# Patient Record
Sex: Female | Born: 2000 | Race: White | Hispanic: No | Marital: Single | Smoking: Never smoker
Health system: Southern US, Community
[De-identification: ages and names within clinical notes are randomized; demographics above are authoritative.]

## PROBLEM LIST (undated history)

## (undated) DIAGNOSIS — F4010 Social phobia, unspecified: Secondary | ICD-10-CM

## (undated) DIAGNOSIS — F913 Oppositional defiant disorder: Secondary | ICD-10-CM

## (undated) DIAGNOSIS — R4689 Other symptoms and signs involving appearance and behavior: Secondary | ICD-10-CM

## (undated) DIAGNOSIS — F329 Major depressive disorder, single episode, unspecified: Secondary | ICD-10-CM

## (undated) DIAGNOSIS — F410 Panic disorder [episodic paroxysmal anxiety] without agoraphobia: Secondary | ICD-10-CM

## (undated) DIAGNOSIS — R4586 Emotional lability: Secondary | ICD-10-CM

## (undated) HISTORY — PX: NO PAST SURGERIES: SHX2092

---
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
## 2019-03-23 ENCOUNTER — Other Ambulatory Visit: Payer: Self-pay

## 2019-03-23 ENCOUNTER — Encounter: Payer: Self-pay | Admitting: Obstetrics and Gynecology

## 2019-03-23 ENCOUNTER — Ambulatory Visit (INDEPENDENT_AMBULATORY_CARE_PROVIDER_SITE_OTHER): Payer: Medicaid Other | Admitting: Obstetrics and Gynecology

## 2019-03-23 ENCOUNTER — Other Ambulatory Visit (HOSPITAL_COMMUNITY)
Admission: RE | Admit: 2019-03-23 | Discharge: 2019-03-23 | Disposition: A | Discharge status: Home / Self Care | Payer: Medicaid Other | Source: Ambulatory Visit | Attending: Obstetrics and Gynecology | Admitting: Obstetrics and Gynecology

## 2019-03-23 VITALS — BP 118/74 | Wt 142.0 lb

## 2019-03-23 DIAGNOSIS — Z3491 Encounter for supervision of normal pregnancy, unspecified, first trimester: Secondary | ICD-10-CM | POA: Insufficient documentation

## 2019-03-23 DIAGNOSIS — Z3401 Encounter for supervision of normal first pregnancy, first trimester: Secondary | ICD-10-CM | POA: Diagnosis not present

## 2019-03-23 DIAGNOSIS — Z113 Encounter for screening for infections with a predominantly sexual mode of transmission: Secondary | ICD-10-CM

## 2019-03-23 DIAGNOSIS — Z3A09 9 weeks gestation of pregnancy: Secondary | ICD-10-CM | POA: Diagnosis not present

## 2019-03-23 DIAGNOSIS — Z3490 Encounter for supervision of normal pregnancy, unspecified, unspecified trimester: Secondary | ICD-10-CM | POA: Insufficient documentation

## 2019-03-23 LAB — OB RESULTS CONSOLE RPR: RPR: NONREACTIVE

## 2019-03-23 LAB — OB RESULTS CONSOLE RUBELLA ANTIBODY, IGM: Rubella: NON-IMMUNE/NOT IMMUNE

## 2019-03-23 LAB — OB RESULTS CONSOLE HIV ANTIBODY (ROUTINE TESTING): HIV: NONREACTIVE

## 2019-03-23 LAB — OB RESULTS CONSOLE VARICELLA ZOSTER ANTIBODY, IGG: Varicella: IMMUNE

## 2019-03-23 LAB — OB RESULTS CONSOLE HEPATITIS B SURFACE ANTIGEN: Hepatitis B Surface Ag: NEGATIVE

## 2019-03-23 NOTE — Progress Notes (Signed)
New Obstetric Patient H&P   Chief Complaint: "Desires prenatal care"   History of Present Illness: Patient is a 18 y.o. G1P0 Not Hispanic or Latino female, LMP 01/19/2019 presents with amenorrhea and positive home pregnancy test. Based on her  LMP, her EDD is Estimated Date of Delivery: 10/26/19 and her EGA is [redacted]w[redacted]d. Cycles are 5. days, regular, and occur approximately every : 28 days.     She had a urine pregnancy test which was positive 3 week(s)  ago. Her last menstrual period was normal and lasted for  5 day(s). Since her LMP she claims she has experienced no issues. She denies vaginal bleeding. Her past medical history is noncontributory. No prior pregnancies.  Since her LMP, she admits to the use of tobacco products  no She claims she has gained 10 pounds since the start of her pregnancy.  There are cats in the home in the home  no  She admits close contact with children on a regular basis  no  She has had chicken pox in the past no (received vaccine) She has had Tuberculosis exposures, symptoms, or previously tested positive for TB   no Current or past history of domestic violence. no  Genetic Screening/Teratology Counseling: (Includes patient, baby's father, or anyone in either family with:)   1. Patient's age >/= 63 at [facility]  no 2. Thalassemia ([place], [place], Mediterranean, or Asian background): MCV<80  no 3. Neural tube defect (meningomyelocele, spina bifida, anencephaly)  no 4. Congenital heart defect  no  5. Down syndrome  no 6. Tay-Sachs (Jewish, [place]  no 7. Canavan's Disease  no 8. Sickle cell disease or trait (African)  no  9. Hemophilia or other blood disorders  no  10. Muscular dystrophy  no  11. Cystic fibrosis  no  12. Huntington's Chorea  no  13. Mental retardation/autism  no 14. Other inherited genetic or chromosomal disorder  no 15. Maternal metabolic disorder (DM, PKU, etc)  no 16. Patient or FOB with a child with a birth defect not listed above no   16a. Patient or FOB with a birth defect themselves no 17. Recurrent pregnancy loss, or stillbirth  no  18. Any medications since LMP other than prenatal vitamins (include vitamins, supplements, OTC meds, drugs, alcohol)  no 19. Any other genetic/environmental exposure to discuss  no  Infection History:   1. Lives with someone with TB or TB exposed  no  2. Patient or partner has history of genital herpes  no 3. Rash or viral illness since LMP  no 4. History of STI (GC, CT, HPV, syphilis, HIV)  no 5. History of recent travel :  no  Other pertinent information:  no     Review of Systems:10 point review of systems negative unless otherwise noted in HPI  Past Medical History:  Diagnosis Date  . Major depressive episode 09/07/2015  . Mood swings   . Oppositional behavior   . Panic attacks 09/07/2015  . Social anxiety disorder 09/07/2015    History reviewed. No pertinent surgical history.  Gynecologic History: Patient's last menstrual period was 01/19/2019.  Obstetric History: G1P0  Family History  Problem Relation Age of Onset  . Thyroid disease Mother     Social History   Socioeconomic History  . Marital status: Single    Spouse name: Not on file  . Number of children: Not on file  . Years of education: Not on file  . Highest education level: Not on file  Occupational History  .  Not on file  Social Needs  . Financial resource strain: Not on file  . Food insecurity:    Worry: Not on file    Inability: Not on file  . Transportation needs:    Medical: Not on file    Non-medical: Not on file  Tobacco Use  . Smoking status: Never Smoker  . Smokeless tobacco: Never Used  Substance and Sexual Activity  . Alcohol use: No  . Drug use: No  . Sexual activity: Yes    Birth control/protection: None  Lifestyle  . Physical activity:    Days per week: Not on file    Minutes per session: Not on file  . Stress: Not on file  Relationships  . Social connections:     Talks on phone: Not on file    Gets together: Not on file    Attends religious service: Not on file    Active member of club or organization: Not on file    Attends meetings of clubs or organizations: Not on file    Relationship status: Not on file  . Intimate partner violence:    Fear of current or ex partner: Not on file    Emotionally abused: Not on file    Physically abused: Not on file    Forced sexual activity: Not on file  Other Topics Concern  . Not on file  Social History Narrative  . Not on file    Allergies  Allergen Reactions  . Amoxicillin     Yeast infections  . Zofran [Ondansetron Hcl]     Prior to Admission medications: PNVs    Physical Exam BP 118/74   Wt 142 lb (64.4 kg)   LMP 01/19/2019   Physical Exam Exam conducted with a chaperone present.  Constitutional:      General: She is not in acute distress.    Appearance: Normal appearance.  HENT:     Head: Normocephalic and atraumatic.  Eyes:     General: No scleral icterus.    Conjunctiva/sclera: Conjunctivae normal.  Neck:     Musculoskeletal: Normal range of motion and neck supple. No neck rigidity.  Cardiovascular:     Rate and Rhythm: Normal rate and regular rhythm.     Heart sounds: No murmur. No friction rub. No gallop.   Pulmonary:     Effort: Pulmonary effort is normal.     Breath sounds: Normal breath sounds. No wheezing, rhonchi or rales.  Abdominal:     General: There is no distension.     Palpations: Abdomen is soft. There is no mass.     Tenderness: There is no abdominal tenderness. There is no guarding or rebound.  Genitourinary:    General: Normal vulva.     Exam position: Lithotomy position.     Pubic Area: No rash or pubic lice.      Labia:        Right: No rash, tenderness, lesion or injury.        Left: No rash, tenderness, lesion or injury.      Vagina: Normal.     Cervix: Normal.     Uterus: Normal.      Adnexa: Right adnexa normal.  Musculoskeletal: Normal range of  motion.        General: No swelling.  Lymphadenopathy:     Lower Body: No right inguinal adenopathy. No left inguinal adenopathy.  Skin:    General: Skin is warm and dry.     Coloration: Skin is not  jaundiced.  Neurological:     General: No focal deficit present.     Mental Status: She is alert and oriented to person, place, and time.     Cranial Nerves: No cranial nerve deficit.  Psychiatric:        Mood and Affect: Mood normal.        Behavior: Behavior normal.        Judgment: Judgment normal.    Female Chaperone present during breast and/or pelvic exam.  Brief BSUS shows single, living IUP at ~4610w1d for EDD of 11/08/2019.  But, will get formal pelvic u/s.    Assessment: 18 y.o. G1P0 at 4125w0d presenting to initiate prenatal care  Plan: 1) Avoid alcoholic beverages. 2) Patient encouraged not to smoke.  3) Discontinue the use of all non-medicinal drugs and chemicals.  4) Take prenatal vitamins daily.  5) Nutrition, food safety (fish, cheese advisories, and high nitrite foods) and exercise discussed. 6) Hospital and practice style discussed with cross coverage system.  7) Genetic Screening, such as with 1st Trimester Screening, cell free fetal DNA, AFP testing, and Ultrasound, as well as with amniocentesis and CVS as appropriate, is discussed with patient. At the conclusion of today's visit patient undecided genetic testing 8) Patient is asked about travel to areas at risk for the [place] virus, and counseled to avoid travel and exposure to mosquitoes or sexual partners who may have themselves been exposed to the virus. Testing is discussed, and will be ordered as appropriate.  9) NOB labs next visit with dating/viability u/s  Thomasene MohairStephen Nickayla Mcinnis, MD 03/23/2019 10:34 AM

## 2019-03-25 LAB — CERVICOVAGINAL ANCILLARY ONLY
Chlamydia: NEGATIVE
Neisseria Gonorrhea: NEGATIVE
Trichomonas: NEGATIVE

## 2019-03-26 LAB — URINE DRUG PANEL 7
Amphetamines, Urine: NEGATIVE ng/mL
Barbiturate Quant, Ur: NEGATIVE ng/mL
Benzodiazepine Quant, Ur: NEGATIVE ng/mL
Cannabinoid Quant, Ur: POSITIVE — AB
Cocaine (Metab.): NEGATIVE ng/mL
Opiate Quant, Ur: NEGATIVE ng/mL
PCP Quant, Ur: NEGATIVE ng/mL

## 2019-03-26 LAB — URINE CULTURE

## 2019-04-19 ENCOUNTER — Other Ambulatory Visit: Payer: Self-pay

## 2019-04-19 ENCOUNTER — Ambulatory Visit (INDEPENDENT_AMBULATORY_CARE_PROVIDER_SITE_OTHER): Payer: Medicaid Other

## 2019-04-19 ENCOUNTER — Encounter: Payer: Self-pay | Admitting: Obstetrics & Gynecology

## 2019-04-19 ENCOUNTER — Ambulatory Visit (INDEPENDENT_AMBULATORY_CARE_PROVIDER_SITE_OTHER): Payer: Medicaid Other | Admitting: Obstetrics & Gynecology

## 2019-04-19 VITALS — BP 120/80 | Wt 141.0 lb

## 2019-04-19 DIAGNOSIS — Z3401 Encounter for supervision of normal first pregnancy, first trimester: Secondary | ICD-10-CM | POA: Diagnosis not present

## 2019-04-19 DIAGNOSIS — Z3687 Encounter for antenatal screening for uncertain dates: Secondary | ICD-10-CM

## 2019-04-19 DIAGNOSIS — Z1389 Encounter for screening for other disorder: Secondary | ICD-10-CM | POA: Diagnosis not present

## 2019-04-19 DIAGNOSIS — Z331 Pregnant state, incidental: Secondary | ICD-10-CM | POA: Diagnosis not present

## 2019-04-19 DIAGNOSIS — Z3491 Encounter for supervision of normal pregnancy, unspecified, first trimester: Secondary | ICD-10-CM

## 2019-04-19 DIAGNOSIS — Z3A12 12 weeks gestation of pregnancy: Secondary | ICD-10-CM

## 2019-04-19 DIAGNOSIS — Z3A09 9 weeks gestation of pregnancy: Secondary | ICD-10-CM

## 2019-04-19 LAB — POCT URINALYSIS DIPSTICK OB
Glucose, UA: NEGATIVE
POC,PROTEIN,UA: NEGATIVE

## 2019-04-19 MED ORDER — CLOTRIMAZOLE-BETAMETHASONE 1-0.05 % EX CREA: 1.0000 "application " | TOPICAL_CREAM | Freq: Two times a day (BID) | CUTANEOUS | 0 refills | Status: DC

## 2019-04-19 NOTE — Progress Notes (Signed)
  Subjective  Fetal Movement? No Nausea? no  Objective  BP 120/80   Wt 141 lb (64 kg)   LMP 01/19/2019  General: NAD Pumonary: no increased work of breathing Abdomen: gravid, non-tender Extremities: no edema Psychiatric: mood appropriate, affect full  Assessment  18 y.o. G1P0 at [redacted]w[redacted]d by  10/26/2019, by Last Menstrual Period presenting for routine prenatal visit  Plan   Problem List Items Addressed This Visit      Other   Supervision of low-risk pregnancy   Relevant Orders   RPR+Rh+ABO+Rub Ab+Ab Scr+CB...    Other Visit Diagnoses    [redacted] weeks gestation of pregnancy    -  Primary      pregnancy Problems (from 03/23/19 to present)    Problem Noted Resolved   Supervision of low-risk pregnancy 03/23/2019 by Conard Novak, MD No     Review of ULTRASOUND.    I have personally reviewed images and report of recent ultrasound done at [facility].    Plan of management to be discussed with patient.  Labs this week (prenatals)  Annamarie Major, MD, [facility], [facility] 04/19/2019  4:39 PM\

## 2019-04-19 NOTE — Patient Instructions (Signed)

## 2019-04-19 NOTE — Addendum Note (Signed)
Addended by: Cornelius Moras D on: 04/19/2019 04:52 PM   Modules accepted: Orders

## 2019-04-20 ENCOUNTER — Other Ambulatory Visit: Payer: Medicaid Other

## 2019-05-17 ENCOUNTER — Encounter: Payer: Medicaid Other | Admitting: Obstetrics and Gynecology

## 2019-05-17 ENCOUNTER — Other Ambulatory Visit: Payer: Self-pay

## 2019-05-21 ENCOUNTER — Other Ambulatory Visit: Payer: Self-pay

## 2019-05-21 DIAGNOSIS — O9989 Other specified diseases and conditions complicating pregnancy, childbirth and the puerperium: Secondary | ICD-10-CM | POA: Diagnosis not present

## 2019-05-21 DIAGNOSIS — Z3A18 18 weeks gestation of pregnancy: Secondary | ICD-10-CM | POA: Insufficient documentation

## 2019-05-21 DIAGNOSIS — Z5321 Procedure and treatment not carried out due to patient leaving prior to being seen by health care provider: Secondary | ICD-10-CM | POA: Diagnosis not present

## 2019-05-21 DIAGNOSIS — R1031 Right lower quadrant pain: Secondary | ICD-10-CM | POA: Diagnosis not present

## 2019-05-21 LAB — CBC
HCT: 35.6 % — ABNORMAL LOW (ref 36.0–46.0)
Hemoglobin: 12.1 g/dL (ref 12.0–15.0)
MCH: 29.9 pg (ref 26.0–34.0)
MCHC: 34 g/dL (ref 30.0–36.0)
MCV: 87.9 fL (ref 80.0–100.0)
Platelets: 178 10*3/uL (ref 150–400)
RBC: 4.05 MIL/uL (ref 3.87–5.11)
RDW: 12.8 % (ref 11.5–15.5)
WBC: 11.8 10*3/uL — ABNORMAL HIGH (ref 4.0–10.5)
nRBC: 0 % (ref 0.0–0.2)

## 2019-05-21 LAB — URINALYSIS, COMPLETE (UACMP) WITH MICROSCOPIC
Bilirubin Urine: NEGATIVE
Glucose, UA: NEGATIVE mg/dL
Hgb urine dipstick: NEGATIVE
Ketones, ur: NEGATIVE mg/dL
Leukocytes,Ua: NEGATIVE
Nitrite: NEGATIVE
Protein, ur: NEGATIVE mg/dL
Specific Gravity, Urine: 1.015 (ref 1.005–1.030)
pH: 6 (ref 5.0–8.0)

## 2019-05-21 LAB — BASIC METABOLIC PANEL
Anion gap: 6 (ref 5–15)
BUN: 7 mg/dL (ref 6–20)
CO2: 24 mmol/L (ref 22–32)
Calcium: 8.9 mg/dL (ref 8.9–10.3)
Chloride: 107 mmol/L (ref 98–111)
Creatinine, Ser: 0.53 mg/dL (ref 0.44–1.00)
GFR calc Af Amer: >60 mL/min (ref >60)
GFR calc non Af Amer: >60 mL/min (ref >60)
Glucose, Bld: 77 mg/dL (ref 70–99)
Potassium: 4 mmol/L (ref 3.5–5.1)
Sodium: 137 mmol/L (ref 135–145)

## 2019-05-21 NOTE — ED Triage Notes (Signed)
Patient reports being approximately [redacted] wks pregnant.  Reports right flank pain for past 3-4 hours.  Denies urinary s/s.

## 2019-05-22 ENCOUNTER — Emergency Department
Admission: EM | Admit: 2019-05-22 | Discharge: 2019-05-22 | Disposition: A | Discharge status: Home / Self Care | Payer: Medicaid Other | Attending: Emergency Medicine | Admitting: Emergency Medicine

## 2019-05-24 ENCOUNTER — Telehealth: Payer: Self-pay | Admitting: Emergency Medicine

## 2019-05-24 NOTE — Telephone Encounter (Signed)
Called patient due to lwot to inquire about condition and follow up plans.  No answer and no voicemail  

## 2019-08-04 ENCOUNTER — Other Ambulatory Visit: Payer: Self-pay

## 2019-08-04 ENCOUNTER — Observation Stay
Admission: EM | Admit: 2019-08-04 | Discharge: 2019-08-04 | Disposition: A | Discharge status: Home / Self Care | Payer: Medicaid Other | Attending: Obstetrics and Gynecology | Admitting: Obstetrics and Gynecology

## 2019-08-04 DIAGNOSIS — R109 Unspecified abdominal pain: Secondary | ICD-10-CM | POA: Diagnosis present

## 2019-08-04 DIAGNOSIS — O99343 Other mental disorders complicating pregnancy, third trimester: Secondary | ICD-10-CM | POA: Diagnosis not present

## 2019-08-04 DIAGNOSIS — F329 Major depressive disorder, single episode, unspecified: Secondary | ICD-10-CM | POA: Insufficient documentation

## 2019-08-04 DIAGNOSIS — R1030 Lower abdominal pain, unspecified: Secondary | ICD-10-CM | POA: Insufficient documentation

## 2019-08-04 DIAGNOSIS — Z3A28 28 weeks gestation of pregnancy: Secondary | ICD-10-CM | POA: Insufficient documentation

## 2019-08-04 DIAGNOSIS — O26893 Other specified pregnancy related conditions, third trimester: Principal | ICD-10-CM | POA: Diagnosis present

## 2019-08-04 DIAGNOSIS — Z79899 Other long term (current) drug therapy: Secondary | ICD-10-CM | POA: Diagnosis not present

## 2019-08-04 DIAGNOSIS — Z7951 Long term (current) use of inhaled steroids: Secondary | ICD-10-CM | POA: Diagnosis not present

## 2019-08-04 DIAGNOSIS — Z791 Long term (current) use of non-steroidal anti-inflammatories (NSAID): Secondary | ICD-10-CM | POA: Diagnosis not present

## 2019-08-04 DIAGNOSIS — Z3490 Encounter for supervision of normal pregnancy, unspecified, unspecified trimester: Secondary | ICD-10-CM

## 2019-08-04 LAB — URINALYSIS, ROUTINE W REFLEX MICROSCOPIC
Bilirubin Urine: NEGATIVE
Glucose, UA: NEGATIVE mg/dL
Hgb urine dipstick: NEGATIVE
Ketones, ur: 5 mg/dL — AB
Leukocytes,Ua: NEGATIVE
Nitrite: NEGATIVE
Protein, ur: NEGATIVE mg/dL
Specific Gravity, Urine: 1.021 (ref 1.005–1.030)
pH: 6 (ref 5.0–8.0)

## 2019-08-04 MED ORDER — ACETAMINOPHEN 500 MG PO TABS
ORAL_TABLET | ORAL | Status: AC
  Filled 2019-08-04: qty 1

## 2019-08-04 MED ORDER — ACETAMINOPHEN 500 MG PO TABS
1000.0000 mg | ORAL_TABLET | Freq: Once | ORAL | Status: AC
  Administered 2019-08-04: 1000 mg via ORAL

## 2019-08-04 MED ORDER — ACETAMINOPHEN 500 MG PO TABS
ORAL_TABLET | ORAL | Status: AC
  Filled 2019-08-04: qty 2

## 2019-08-04 NOTE — Discharge Summary (Signed)
Linda SequinCathy E Parks is a 18 y.o. female. She is at 8980w1d gestation. Patient's last menstrual period was 01/19/2019. Estimated Date of Delivery: 10/26/19  [facility]   Current pregnancy complicated by:  1. MJ use  2. Rubella Non-immune  Chief complaint: sharp stabbing lower abdominal pains  Location: lower abdomen Onset/timing: started at 2345 on 9/15, while sitting down to use bathroom for BM.  Duration: intermittent Quality: sharp stabbing pains Severity: 7/10 Aggravating or alleviating conditions: noted while using bathroom this evening. Intercourse in last 24hrs, Associated signs/symptoms: denies dysuria, LOF, menstrual like cramping. Denies vaginal discharge, itching or burning.  Context: [facility]  S: Resting comfortably. no CTX, no VB.no LOF,  Active fetal movement. Denies: HA, visual changes, SOB, or RUQ/epigastric pain  Maternal Medical History:   Past Medical History:  Diagnosis Date  . Major depressive episode 09/07/2015  . Mood swings   . Oppositional behavior   . Panic attacks 09/07/2015  . Social anxiety disorder 09/07/2015    History reviewed. No pertinent surgical history.  Allergies  Allergen Reactions  . Amoxicillin     Yeast infections  . Zofran [Ondansetron Hcl]     Prior to Admission medications   Medication Sig Start Date End Date Taking? Authorizing Provider  Multiple Vitamin (MULTIVITAMIN WITH MINERALS) TABS tablet Take 1 tablet by mouth daily. 12/13/15  Yes Thedora HindersSevilla Saez-Benito, Miriam, MD  acetaminophen-codeine (TYLENOL #3) 300-30 MG tablet Take 1 tablet by mouth every 6 (six) hours as needed for moderate pain. Patient not taking: Reported on 08/04/2019 01/27/18   Menshew, Charlesetta IvoryJenise V Bacon, PA-C  amoxicillin (AMOXIL) 875 MG tablet Take 1 tablet (875 mg total) by mouth 2 (two) times daily. Patient not taking: Reported on 08/04/2019 05/21/17   Sharman CheekStafford, Phillip, MD  azithromycin (ZITHROMAX Z-PAK)  250 MG tablet Take 2 tablets (500 mg) on  Day 1,  followed by 1 tablet (250 mg) once daily on Days 2 through 5. Patient not taking: Reported on 08/04/2019 01/27/18   Menshew, Charlesetta IvoryJenise V Bacon, PA-C  benzonatate (TESSALON PERLES) 100 MG capsule Take 1 capsule (100 mg total) by mouth 3 (three) times daily as needed for cough (Take 1-2 per dose). Patient not taking: Reported on 08/04/2019 01/27/18   Menshew, Charlesetta IvoryJenise V Bacon, PA-C  cetirizine (ZYRTEC) 1 MG/ML syrup Take 10 mg by mouth daily as needed (for itching/redness). Reported on 12/03/2015    [provider]  clotrimazole-betamethasone (LOTRISONE) cream Apply 1 application topically 2 (two) times daily. Patient not taking: Reported on 08/04/2019 04/19/19   Nadara MustardHarris, Robert P, MD  cyproheptadine (PERIACTIN) 4 MG tablet Take 0.5 tablets (2 mg total) by mouth 2 (two) times daily as needed (decrease appetite). Only take if patient continues with decreased appetite and loosing weight. Patient not taking: Reported on 08/04/2019 12/13/15   Thedora HindersSevilla Saez-Benito, Miriam, MD  diphenhydrAMINE (BENADRYL) 25 mg capsule Take 1 capsule (25 mg total) by mouth every 6 (six) hours as needed. 03/26/18   Triplett, Rulon Eisenmengerari B, FNP  famotidine (PEPCID) 40 MG tablet Take 1 tablet (40 mg total) by mouth every evening. 01/31/16 01/30/17  Rebecka ApleyWebster, Allison P, MD  FLUoxetine (PROZAC) 10 MG capsule Please give 20mg  tabs.  Take 1 and 1/2 tab by mouth after breakfast to make total daily dose of 30mg . Patient not taking: Reported on 08/04/2019 12/13/15   Thedora HindersSevilla Saez-Benito, Miriam, MD  fluticasone [facility](FLONASE) 50 MCG/ACT nasal spray Place 2 sprays into both nostrils daily. Patient not taking:  Reported on 08/04/2019 11/13/17   Menshew, Charlesetta Ivory, PA-C  hydrocortisone 2.5 % cream Apply 1 application topically 2 (two) times daily as needed (for itching).    [provider]  ibuprofen (ADVIL,MOTRIN) 400 MG tablet Take 400 mg by mouth every 6 (six) hours as needed for cramping.     [provider]  ibuprofen (ADVIL,MOTRIN) 800 MG tablet Take 1 tablet (800 mg total) by mouth every 8 (eight) hours as needed. Patient not taking: Reported on 08/04/2019 06/12/17   Rebecka Apley, MD  magic mouthwash w/lidocaine SOLN Take 5 mLs by mouth 4 (four) times daily as needed for mouth pain. 02/01/18   Menshew, Charlesetta Ivory, PA-C  metoCLOPramide (REGLAN) 5 MG tablet Take 1 tablet (5 mg total) by mouth every 8 (eight) hours as needed for nausea. 01/31/16 01/30/17  Rebecka Apley, MD  ondansetron (ZOFRAN ODT) 4 MG disintegrating tablet Take 1 tablet (4 mg total) by mouth every 8 (eight) hours as needed. 01/27/18   Menshew, Charlesetta Ivory, PA-C  predniSONE (STERAPRED UNI-PAK 21 TAB) 10 MG (21) TBPK tablet Take 6 tablets on day 1 Take 5 tablets on day 2 Take 4 tablets on day 3 Take 3 tablets on day 4 Take 2 tablets on day 5 Take 1 tablet on day 6 Patient not taking: Reported on 08/04/2019 03/26/18   Kem Boroughs B, FNP  Prenatal Vit-Fe Fumarate-FA (MULTIVITAMIN-PRENATAL) 27-0.8 MG TABS tablet Take 1 tablet by mouth daily at 12 noon.    [provider]  risperiDONE (RISPERDAL) 1 MG tablet Take 1 tablet (1 mg total) by mouth 2 (two) times daily. 01/06/16 01/05/17  Phineas Semen, MD  sulfamethoxazole-trimethoprim (BACTRIM DS,SEPTRA DS) 800-160 MG tablet Take 1 tablet by mouth 2 (two) times daily. Patient not taking: Reported on 08/04/2019 03/19/16   Evangeline Dakin, PA-C  sulfamethoxazole-trimethoprim (BACTRIM DS,SEPTRA DS) 800-160 MG tablet Take 1 tablet by mouth 2 (two) times daily. Patient not taking: Reported on 08/04/2019 06/12/17   Rebecka Apley, MD      Social History: She  reports that she has never smoked. She has never used smokeless tobacco. She reports that she does not drink alcohol or use drugs.  Family History: family history includes Thyroid disease in her mother.   Review of Systems: A full review of systems was performed and negative except as  noted in the HPI.     O:  BP 121/64 (BP Location: Right Arm)   Pulse (!) 109   Temp 98 F (36.7 C) (Oral)   Resp 19   Ht 5' (1.524 m)   Wt 66.7 kg   LMP 01/19/2019   BMI 28.71 kg/m  Results for orders placed or performed during the hospital encounter of 08/04/19 (from the past 48 hour(s))  Urinalysis, Routine w reflex microscopic   Collection Time: 08/04/19 12:53 AM  Result Value Ref Range   Color, Urine YELLOW (A) YELLOW   APPearance HAZY (A) CLEAR   Specific Gravity, Urine 1.021 1.005 - 1.030   pH 6.0 5.0 - 8.0   Glucose, UA NEGATIVE NEGATIVE mg/dL   Hgb urine dipstick NEGATIVE NEGATIVE   Bilirubin Urine NEGATIVE NEGATIVE   Ketones, ur 5 (A) NEGATIVE mg/dL   Protein, ur NEGATIVE NEGATIVE mg/dL   Nitrite NEGATIVE NEGATIVE   Leukocytes,Ua NEGATIVE NEGATIVE     No physical exam done, FHR tracing reviewed, labs reviewed.   Fetal  monitoring: Cat I Appropriate for GA; Reactive NST Baseline: 135bpm Variability: moderate Accelerations:  present x >2, 10*10 Decelerations absent  Toco: mild Uterinie irritability that resolved prior to Discharge   A/P: 18 y.o. [redacted]w[redacted]d here for antenatal surveillance for lower abdominal  pain  Principle Diagnosis:  LIgament pains in pregnancy, [redacted]wks gestation   Preterm labor: not present.   Fetal Wellbeing: Reassuring Cat 1 tracing with reactive NST   UA unremarkable, pt given PO fluids for hydration and tylenol for discomfort with resolution of pain.   D/c home stable, precautions reviewed, follow-up as scheduled.    Francetta Found, CNM 08/04/2019  6:47 AM

## 2019-08-04 NOTE — OB Triage Note (Signed)
Patient came in for observation for sharp lower abdominal pain that started last night at 2345 after standing up from using the bathroom. Patient rates pain 7/10. Patient denies uterine contractions upon arrival but appear to be having irregular contractions. Patient denies leaking of fluid and denies vaginal bleeding and spotting. Vital signs stable and patient afebrile. Patient reports sexual intercourse yesterday moring around 0900. FHR baseline 135 with moderate variability with accelerations 15 x 15 and no decelerations. Significant other at bedside. Will continue to monitor.

## 2019-08-29 ENCOUNTER — Observation Stay
Admission: EM | Admit: 2019-08-29 | Discharge: 2019-08-29 | Disposition: A | Discharge status: Home / Self Care | Payer: Medicaid Other | Attending: Obstetrics and Gynecology | Admitting: Obstetrics and Gynecology

## 2019-08-29 ENCOUNTER — Encounter: Payer: Self-pay | Admitting: Obstetrics and Gynecology

## 2019-08-29 DIAGNOSIS — B9689 Other specified bacterial agents as the cause of diseases classified elsewhere: Secondary | ICD-10-CM | POA: Insufficient documentation

## 2019-08-29 DIAGNOSIS — Z3A31 31 weeks gestation of pregnancy: Secondary | ICD-10-CM | POA: Insufficient documentation

## 2019-08-29 DIAGNOSIS — Z7951 Long term (current) use of inhaled steroids: Secondary | ICD-10-CM | POA: Insufficient documentation

## 2019-08-29 DIAGNOSIS — O2303 Infections of kidney in pregnancy, third trimester: Secondary | ICD-10-CM | POA: Insufficient documentation

## 2019-08-29 DIAGNOSIS — O99343 Other mental disorders complicating pregnancy, third trimester: Secondary | ICD-10-CM | POA: Insufficient documentation

## 2019-08-29 DIAGNOSIS — Z3493 Encounter for supervision of normal pregnancy, unspecified, third trimester: Secondary | ICD-10-CM

## 2019-08-29 DIAGNOSIS — Z79899 Other long term (current) drug therapy: Secondary | ICD-10-CM | POA: Insufficient documentation

## 2019-08-29 DIAGNOSIS — Z3490 Encounter for supervision of normal pregnancy, unspecified, unspecified trimester: Secondary | ICD-10-CM

## 2019-08-29 DIAGNOSIS — F319 Bipolar disorder, unspecified: Secondary | ICD-10-CM | POA: Insufficient documentation

## 2019-08-29 LAB — URINALYSIS, ROUTINE W REFLEX MICROSCOPIC
Bilirubin Urine: NEGATIVE
Glucose, UA: NEGATIVE mg/dL
Hgb urine dipstick: NEGATIVE
Ketones, ur: NEGATIVE mg/dL
Leukocytes,Ua: NEGATIVE
Nitrite: NEGATIVE
Protein, ur: NEGATIVE mg/dL
Specific Gravity, Urine: 1.015 (ref 1.005–1.030)
pH: 7 (ref 5.0–8.0)

## 2019-08-29 LAB — WET PREP, GENITAL
Clue Cells Wet Prep HPF POC: NONE SEEN
Sperm: NONE SEEN
Trich, Wet Prep: NONE SEEN
Yeast Wet Prep HPF POC: NONE SEEN

## 2019-08-29 LAB — FETAL FIBRONECTIN: Fetal Fibronectin: NEGATIVE

## 2019-08-29 NOTE — OB Triage Note (Signed)
Pt present with c/o lower back pain, primarily on the L side radiating toward R side that began last night and was associated with N/V. N/V has resolved today. Pt also feels she has experienced decreased fetal movement. FM+ on RN abdominal assessment. Pt denies vaginal bleeding, leaking of fluid, and ctx. Toco and EFM applied and explained. Will monitor closely.

## 2019-08-29 NOTE — Discharge Instructions (Signed)
Please call or return to [facility] with any of the following concerns: Vaginal Bleeding Decreased Fetal Movement Contractions every 3-5 mins for an hour Leaking Fluid

## 2019-08-29 NOTE — OB Triage Note (Signed)
TRIAGE NOTE to rule out Preterm Labor   History of Present Illness: Linda Parks is a 18 y.o. G1P0 at 2782w5d presenting to triage for preterm pain.  1 day of lower back pain, worse on left. Decreased fetal movement today.  No dysuria, vaginal bleeding. Baby moving now well. No fever, chills, nausea or vomiting.  Patient Active Problem List   Diagnosis Date Noted  . Abdominal pain in pregnancy, third trimester 08/04/2019  . Pregnancy 08/04/2019  . Supervision of low-risk pregnancy 03/23/2019  . Decrease in appetite   . Bipolar 1 disorder, depressed, moderate (HCC) 12/02/2015  . Major depressive episode 09/07/2015  . Social anxiety disorder 09/07/2015  . Panic attacks 09/07/2015    Past Medical History:  Diagnosis Date  . Major depressive episode 09/07/2015  . Mood swings   . Oppositional behavior   . Panic attacks 09/07/2015  . Social anxiety disorder 09/07/2015    Past Surgical History:  Procedure Laterality Date  . NO PAST SURGERIES      OB History  Gravida Para Term Preterm AB Living  1            SAB TAB Ectopic Multiple Live Births               # Outcome Date GA Lbr Len/2nd Weight Sex Delivery Anes PTL Lv  1 Current             Social History   Socioeconomic History  . Marital status: Single    Spouse name: Not on file  . Number of children: Not on file  . Years of education: Not on file  . Highest education level: Not on file  Occupational History  . Not on file  Social Needs  . Financial resource strain: Not on file  . Food insecurity    Worry: Not on file    Inability: Not on file  . Transportation needs    Medical: Not on file    Non-medical: Not on file  Tobacco Use  . Smoking status: Never Smoker  . Smokeless tobacco: Never Used  Substance and Sexual Activity  . Alcohol use: No  . Drug use: No  . Sexual activity: Yes    Birth control/protection: None  Lifestyle  . Physical activity    Days per week: Not on file    Minutes per  session: Not on file  . Stress: Not on file  Relationships  . Social Musicianconnections    Talks on phone: Not on file    Gets together: Not on file    Attends religious service: Not on file    Active member of club or organization: Not on file    Attends meetings of clubs or organizations: Not on file    Relationship status: Not on file  Other Topics Concern  . Not on file  Social History Narrative  . Not on file    Family History  Problem Relation Age of Onset  . Thyroid disease Mother     Allergies  Allergen Reactions  . Amoxicillin     Yeast infections  . Zofran [Ondansetron Hcl]     Medications Prior to Admission  Medication Sig Dispense Refill Last Dose  . acetaminophen-codeine (TYLENOL #3) 300-30 MG tablet Take 1 tablet by mouth every 6 (six) hours as needed for moderate pain. (Patient not taking: Reported on 08/04/2019) 10 tablet 0   . amoxicillin (AMOXIL) 875 MG tablet Take 1 tablet (875 mg total) by mouth 2 (two) times  daily. (Patient not taking: Reported on 08/04/2019) 14 tablet 0   . azithromycin (ZITHROMAX Z-PAK) 250 MG tablet Take 2 tablets (500 mg) on  Day 1,  followed by 1 tablet (250 mg) once daily on Days 2 through 5. (Patient not taking: Reported on 08/04/2019) 6 each 0   . benzonatate (TESSALON PERLES) 100 MG capsule Take 1 capsule (100 mg total) by mouth 3 (three) times daily as needed for cough (Take 1-2 per dose). (Patient not taking: Reported on 08/04/2019) 30 capsule 0   . cetirizine (ZYRTEC) 1 MG/ML syrup Take 10 mg by mouth daily as needed (for itching/redness). Reported on 12/03/2015     . clotrimazole-betamethasone (LOTRISONE) cream Apply 1 application topically 2 (two) times daily. (Patient not taking: Reported on 08/04/2019) 30 g 0   . cyproheptadine (PERIACTIN) 4 MG tablet Take 0.5 tablets (2 mg total) by mouth 2 (two) times daily as needed (decrease appetite). Only take if patient continues with decreased appetite and loosing weight. (Patient not taking:  Reported on 08/04/2019) 30 tablet 0   . diphenhydrAMINE (BENADRYL) 25 mg capsule Take 1 capsule (25 mg total) by mouth every 6 (six) hours as needed. 30 capsule 0   . famotidine (PEPCID) 40 MG tablet Take 1 tablet (40 mg total) by mouth every evening. 15 tablet 0   . FLUoxetine (PROZAC) 10 MG capsule Please give 20mg  tabs.  Take 1 and 1/2 tab by mouth after breakfast to make total daily dose of 30mg . (Patient not taking: Reported on 08/04/2019) 45 capsule 0   . fluticasone (FLONASE) 50 MCG/ACT nasal spray Place 2 sprays into both nostrils daily. (Patient not taking: Reported on 08/04/2019) 16 g 0   . hydrocortisone 2.5 % cream Apply 1 application topically 2 (two) times daily as needed (for itching).     Marland Kitchen ibuprofen (ADVIL,MOTRIN) 400 MG tablet Take 400 mg by mouth every 6 (six) hours as needed for cramping.     Marland Kitchen ibuprofen (ADVIL,MOTRIN) 800 MG tablet Take 1 tablet (800 mg total) by mouth every 8 (eight) hours as needed. (Patient not taking: Reported on 08/04/2019) 15 tablet 0   . magic mouthwash w/lidocaine SOLN Take 5 mLs by mouth 4 (four) times daily as needed for mouth pain. 100 mL 0   . metoCLOPramide (REGLAN) 5 MG tablet Take 1 tablet (5 mg total) by mouth every 8 (eight) hours as needed for nausea. 15 tablet 0   . Multiple Vitamin (MULTIVITAMIN WITH MINERALS) TABS tablet Take 1 tablet by mouth daily. 30 tablet 0   . ondansetron (ZOFRAN ODT) 4 MG disintegrating tablet Take 1 tablet (4 mg total) by mouth every 8 (eight) hours as needed. 15 tablet 0   . predniSONE (STERAPRED UNI-PAK 21 TAB) 10 MG (21) TBPK tablet Take 6 tablets on day 1 Take 5 tablets on day 2 Take 4 tablets on day 3 Take 3 tablets on day 4 Take 2 tablets on day 5 Take 1 tablet on day 6 (Patient not taking: Reported on 08/04/2019) 21 tablet 0   . Prenatal Vit-Fe Fumarate-FA (MULTIVITAMIN-PRENATAL) 27-0.8 MG TABS tablet Take 1 tablet by mouth daily at 12 noon.     . risperiDONE (RISPERDAL) 1 MG tablet Take 1 tablet (1 mg total) by  mouth 2 (two) times daily. 60 tablet 2   . sulfamethoxazole-trimethoprim (BACTRIM DS,SEPTRA DS) 800-160 MG tablet Take 1 tablet by mouth 2 (two) times daily. (Patient not taking: Reported on 08/04/2019) 20 tablet 0   . sulfamethoxazole-trimethoprim (BACTRIM DS,SEPTRA DS)  800-160 MG tablet Take 1 tablet by mouth 2 (two) times daily. (Patient not taking: Reported on 08/04/2019) 14 tablet 0     Review of Systems - See HPI for OB specific ROS.   Vitals:  LMP 01/19/2019  Physical Examination: CONSTITUTIONAL: Well-developed, well-nourished female in no acute distress.  HENT:  Normocephalic, atraumatic EYES: Conjunctivae and EOM are normal. No scleral icterus.  NECK: Normal range of motion, supple, SKIN: Skin is warm and dry. No rash noted. Not diaphoretic. No erythema. No pallor. NEUROLGIC: Alert and oriented to person, place, and time. No gross cranial nerve deficit noted. PSYCHIATRIC: Normal mood and affect. Normal behavior. Normal judgment and thought content. RESPIRATORY: Effort and breath sounds normal, no problems with respiration noted ABDOMEN: Soft, nontender, nondistended, gravid.  Extremities: straight leg test neg  Cervix: 0 thick posterior and firm Membranes:intact Fetal Monitoring:Baseline: 135 bpm, Variability: Good {> 6 bpm), Accelerations: Reactive and Decelerations: Absent  Tocometer: Flat  Labs:  No results found for this or any previous visit (from the past 24 hour(s)).  Imaging Studies: No results found.   Assessment and Plan: Patient Active Problem List   Diagnosis Date Noted  . Abdominal pain in pregnancy, third trimester 08/04/2019  . Pregnancy 08/04/2019  . Supervision of low-risk pregnancy 03/23/2019  . Decrease in appetite   . Bipolar 1 disorder, depressed, moderate (HCC) 12/02/2015  . Major depressive episode 09/07/2015  . Social anxiety disorder 09/07/2015  . Panic attacks 09/07/2015    1. ddx preterm labor, pyleonephritis, BV, yeast, UTI. - FFN  canceled without contractions and firm closed cervix - wet prep - UA  2. Reassuring NST, reactive for gestational age 32x10  Cline Cools, MD, MPH

## 2019-10-12 LAB — OB RESULTS CONSOLE GBS: GBS: NEGATIVE

## 2019-10-18 ENCOUNTER — Other Ambulatory Visit: Payer: Self-pay

## 2019-10-18 ENCOUNTER — Encounter: Payer: Self-pay | Admitting: Emergency Medicine

## 2019-10-18 ENCOUNTER — Inpatient Hospital Stay
Admission: EM | Admit: 2019-10-18 | Discharge: 2019-10-22 | DRG: 787 | Disposition: A | Discharge status: Home / Self Care | Payer: Medicaid Other | Attending: Obstetrics and Gynecology | Admitting: Obstetrics and Gynecology

## 2019-10-18 DIAGNOSIS — A568 Sexually transmitted chlamydial infection of other sites: Secondary | ICD-10-CM | POA: Diagnosis present

## 2019-10-18 DIAGNOSIS — D649 Anemia, unspecified: Secondary | ICD-10-CM | POA: Diagnosis present

## 2019-10-18 DIAGNOSIS — O1414 Severe pre-eclampsia complicating childbirth: Principal | ICD-10-CM | POA: Diagnosis present

## 2019-10-18 DIAGNOSIS — Z3A38 38 weeks gestation of pregnancy: Secondary | ICD-10-CM

## 2019-10-18 DIAGNOSIS — O163 Unspecified maternal hypertension, third trimester: Secondary | ICD-10-CM | POA: Diagnosis present

## 2019-10-18 DIAGNOSIS — O9832 Other infections with a predominantly sexual mode of transmission complicating childbirth: Secondary | ICD-10-CM | POA: Diagnosis present

## 2019-10-18 DIAGNOSIS — O9902 Anemia complicating childbirth: Secondary | ICD-10-CM | POA: Diagnosis present

## 2019-10-18 DIAGNOSIS — O9912 Other diseases of the blood and blood-forming organs and certain disorders involving the immune mechanism complicating childbirth: Secondary | ICD-10-CM | POA: Diagnosis present

## 2019-10-18 DIAGNOSIS — D6959 Other secondary thrombocytopenia: Secondary | ICD-10-CM | POA: Diagnosis present

## 2019-10-18 DIAGNOSIS — R21 Rash and other nonspecific skin eruption: Secondary | ICD-10-CM | POA: Diagnosis present

## 2019-10-18 DIAGNOSIS — Z20828 Contact with and (suspected) exposure to other viral communicable diseases: Secondary | ICD-10-CM | POA: Diagnosis present

## 2019-10-18 DIAGNOSIS — O26893 Other specified pregnancy related conditions, third trimester: Secondary | ICD-10-CM | POA: Diagnosis present

## 2019-10-18 LAB — URINALYSIS, ROUTINE W REFLEX MICROSCOPIC
Bilirubin Urine: NEGATIVE
Glucose, UA: NEGATIVE mg/dL
Hgb urine dipstick: NEGATIVE
Ketones, ur: NEGATIVE mg/dL
Leukocytes,Ua: NEGATIVE
Nitrite: NEGATIVE
Protein, ur: 100 mg/dL — AB
Specific Gravity, Urine: 1.03 (ref 1.005–1.030)
pH: 5 (ref 5.0–8.0)

## 2019-10-18 LAB — CBC
HCT: 30.2 % — ABNORMAL LOW (ref 36.0–46.0)
Hemoglobin: 9.9 g/dL — ABNORMAL LOW (ref 12.0–15.0)
MCH: 27.9 pg (ref 26.0–34.0)
MCHC: 32.8 g/dL (ref 30.0–36.0)
MCV: 85.1 fL (ref 80.0–100.0)
Platelets: 127 K/uL — ABNORMAL LOW (ref 150–400)
RBC: 3.55 MIL/uL — ABNORMAL LOW (ref 3.87–5.11)
RDW: 12.7 % (ref 11.5–15.5)
WBC: 12.6 K/uL — ABNORMAL HIGH (ref 4.0–10.5)
nRBC: 0 % (ref 0.0–0.2)

## 2019-10-18 LAB — COMPREHENSIVE METABOLIC PANEL
ALT: 12 U/L (ref 0–44)
AST: 19 U/L (ref 15–41)
Albumin: 2.7 g/dL — ABNORMAL LOW (ref 3.5–5.0)
Alkaline Phosphatase: 144 U/L — ABNORMAL HIGH (ref 38–126)
Anion gap: 12 (ref 5–15)
BUN: 9 mg/dL (ref 6–20)
CO2: 16 mmol/L — ABNORMAL LOW (ref 22–32)
Calcium: 8.5 mg/dL — ABNORMAL LOW (ref 8.9–10.3)
Chloride: 110 mmol/L (ref 98–111)
Creatinine, Ser: 0.71 mg/dL (ref 0.44–1.00)
GFR calc Af Amer: >60 mL/min (ref >60)
GFR calc non Af Amer: >60 mL/min (ref >60)
Glucose, Bld: 64 mg/dL — ABNORMAL LOW (ref 70–99)
Potassium: 4.2 mmol/L (ref 3.5–5.1)
Sodium: 138 mmol/L (ref 135–145)
Total Bilirubin: 0.9 mg/dL (ref 0.3–1.2)
Total Protein: 6.1 g/dL — ABNORMAL LOW (ref 6.5–8.1)

## 2019-10-18 LAB — URINE DRUG SCREEN, QUALITATIVE (ARMC ONLY)
Amphetamines, Ur Screen: NOT DETECTED
Barbiturates, Ur Screen: NOT DETECTED
Benzodiazepine, Ur Scrn: NOT DETECTED
Cannabinoid 50 Ng, Ur ~~LOC~~: NOT DETECTED
Cocaine Metabolite,Ur ~~LOC~~: NOT DETECTED
MDMA (Ecstasy)Ur Screen: NOT DETECTED
Methadone Scn, Ur: NOT DETECTED
Opiate, Ur Screen: NOT DETECTED
Phencyclidine (PCP) Ur S: NOT DETECTED
Tricyclic, Ur Screen: NOT DETECTED

## 2019-10-18 LAB — TYPE AND SCREEN
ABO/RH(D): A POS
Antibody Screen: NEGATIVE

## 2019-10-18 LAB — PROTEIN / CREATININE RATIO, URINE
Creatinine, Urine: 372 mg/dL
Protein Creatinine Ratio: 3.16 mg/mg{creat} — ABNORMAL HIGH (ref 0.00–0.15)
Total Protein, Urine: 1174 mg/dL

## 2019-10-18 LAB — CHLAMYDIA/NGC RT PCR (ARMC ONLY)
Chlamydia Tr: DETECTED — AB
N gonorrhoeae: NOT DETECTED

## 2019-10-18 MED ORDER — DIPHENHYDRAMINE HCL 50 MG/ML IJ SOLN
25.0000 mg | Freq: Four times a day (QID) | INTRAMUSCULAR | Status: DC | PRN
  Filled 2019-10-18: qty 1

## 2019-10-18 MED ORDER — BUTORPHANOL TARTRATE 1 MG/ML IJ SOLN
1.0000 mg | INTRAMUSCULAR | Status: DC | PRN
  Administered 2019-10-19 (×3): 1 mg via INTRAVENOUS
  Filled 2019-10-18 (×3): qty 1

## 2019-10-18 MED ORDER — LACTATED RINGERS IV SOLN
INTRAVENOUS | Status: DC
  Administered 2019-10-18 – 2019-10-20 (×4): via INTRAVENOUS

## 2019-10-18 MED ORDER — LIDOCAINE HCL (PF) 1 % IJ SOLN
30.0000 mL | INTRAMUSCULAR | Status: DC | PRN
  Filled 2019-10-18: qty 30

## 2019-10-18 MED ORDER — TERBUTALINE SULFATE 1 MG/ML IJ SOLN: 0.2500 mg | Freq: Once | INTRAMUSCULAR | Status: DC | PRN

## 2019-10-18 MED ORDER — OXYTOCIN BOLUS FROM INFUSION: 500.0000 mL | Freq: Once | INTRAVENOUS | Status: DC

## 2019-10-18 MED ORDER — OXYTOCIN 40 UNITS IN NORMAL SALINE INFUSION - SIMPLE MED
1.0000 m[IU]/min | INTRAVENOUS | Status: DC
  Administered 2019-10-19: 05:00:00 2 m[IU]/min via INTRAVENOUS
  Filled 2019-10-18: qty 1000

## 2019-10-18 MED ORDER — SOD CITRATE-CITRIC ACID 500-334 MG/5ML PO SOLN
30.0000 mL | ORAL | Status: DC | PRN
  Administered 2019-10-20: 10:00:00 30 mL via ORAL
  Filled 2019-10-18: qty 30

## 2019-10-18 MED ORDER — LACTATED RINGERS IV SOLN
500.0000 mL | INTRAVENOUS | Status: DC | PRN
  Administered 2019-10-19: 1000 mL via INTRAVENOUS

## 2019-10-18 MED ORDER — OXYTOCIN 40 UNITS IN NORMAL SALINE INFUSION - SIMPLE MED
2.5000 [IU]/h | INTRAVENOUS | Status: DC
  Administered 2019-10-20: 12:00:00 2.5 [IU]/h via INTRAVENOUS

## 2019-10-18 MED ORDER — PROMETHAZINE HCL 25 MG/ML IJ SOLN
25.0000 mg | Freq: Four times a day (QID) | INTRAMUSCULAR | Status: DC | PRN
  Administered 2019-10-19: 25 mg via INTRAVENOUS
  Filled 2019-10-18: qty 1

## 2019-10-18 MED ORDER — ACETAMINOPHEN 325 MG PO TABS: 650.0000 mg | ORAL_TABLET | ORAL | Status: DC | PRN

## 2019-10-18 NOTE — OB Triage Note (Signed)
Patient came in for observation for abdominal rash that started three days ago that itches. Patient reports irregular uterine contractions. Patient rates abdominal and back pain 6/10. Patient denies leaking of fluid and denies vaginal bleeding and spotting.Pateint reports +FM at arrival to [facility]. Vital signs stable and patient afebrile. FHR baseline 130 with moderate variability with accelerations 15 x 15 and no decelerations. Mother at bedside. Will continue to monitor.

## 2019-10-18 NOTE — ED Notes (Signed)
This RN called 3rd floor, explained pt has BLE 2+, swollen eye lids, bpp 140/91. Waiting on return call to see if they will take her upstairs. Pt in mild back discomfort at this time.

## 2019-10-18 NOTE — ED Triage Notes (Signed)
Pt here with c/o red, raised rash that appeared about 4 days ago. Denies use of any new lotions or soaps, states rash itches, hasn't taken any benadryl, is [redacted] weeks pregnant, also presenting with BLE 2+ pitting edema to feet bilaterally, pt states her eye lids are swollen, bp in triage 140/91, states it hasn't been this high. NAD, denies shob.

## 2019-10-18 NOTE — H&P (Signed)
OB History & Physical   History of Present Illness:  Chief Complaint: abdominal rash  HPI:  Linda Parks is a 18 y.o. G1P0 female at [redacted]w[redacted]d dated by [redacted]w[redacted]d ultrasound.  She presents to L&D for abdominal rash and contractions.  She reports:  -active fetal movement -no leakage of fluid  -no vaginal bleeding -onset of regular contractions this afternoon/evening  Pregnancy Issues: 1. Chlamydia positive at 36 weeks, treated 10/11/2019 2. Prior marijuana use, stopped in early pregnancy 3. Rubella non-immune 4. Gestational thrombocytopenia 5. Gestational hypertension   Maternal Medical History:   Past Medical History:  Diagnosis Date  . Major depressive episode 09/07/2015  . Mood swings   . Oppositional behavior   . Panic attacks 09/07/2015  . Social anxiety disorder 09/07/2015    Past Surgical History:  Procedure Laterality Date  . NO PAST SURGERIES      Allergies  Allergen Reactions  . Amoxicillin     Yeast infections  . Zofran [Ondansetron Hcl]     Prior to Admission medications   Medication Sig Start Date End Date Taking? Authorizing Provider  Prenatal Vit-Fe Fumarate-FA (MULTIVITAMIN-PRENATAL) 27-0.8 MG TABS tablet Take 1 tablet by mouth daily at 12 noon.   Yes [provider]  clotrimazole-betamethasone (LOTRISONE) cream Apply 1 application topically 2 (two) times daily. Patient not taking: Reported on 08/04/2019 04/19/19   Gae Dry, MD  cyproheptadine (PERIACTIN) 4 MG tablet Take 0.5 tablets (2 mg total) by mouth 2 (two) times daily as needed (decrease appetite). Only take if patient continues with decreased appetite and loosing weight. Patient not taking: Reported on 08/04/2019 12/13/15   Philipp Ovens, MD  diphenhydrAMINE (BENADRYL) 25 mg capsule Take 1 capsule (25 mg total) by mouth every 6 (six) hours as needed. Patient not taking: Reported on 10/18/2019 03/26/18   Sherrie George B, FNP  famotidine (PEPCID) 40 MG tablet Take 1 tablet  (40 mg total) by mouth every evening. 01/31/16 01/30/17  Loney Hering, MD  FLUoxetine (PROZAC) 10 MG capsule Please give 20mg  tabs.  Take 1 and 1/2 tab by mouth after breakfast to make total daily dose of 30mg . Patient not taking: Reported on 08/04/2019 12/13/15   Philipp Ovens, MD  fluticasone [facility]) 50 MCG/ACT nasal spray Place 2 sprays into both nostrils daily. Patient not taking: Reported on 08/04/2019 11/13/17   Menshew, Dannielle Karvonen, PA-C  hydrocortisone 2.5 % cream Apply 1 application topically 2 (two) times daily as needed (for itching).    [provider]  ibuprofen (ADVIL,MOTRIN) 400 MG tablet Take 400 mg by mouth every 6 (six) hours as needed for cramping.    [provider]  ibuprofen (ADVIL,MOTRIN) 800 MG tablet Take 1 tablet (800 mg total) by mouth every 8 (eight) hours as needed. Patient not taking: Reported on 08/04/2019 06/12/17   Loney Hering, MD  magic mouthwash w/lidocaine SOLN Take 5 mLs by mouth 4 (four) times daily as needed for mouth pain. Patient not taking: Reported on 10/18/2019 02/01/18   Menshew, Dannielle Karvonen, PA-C  metoCLOPramide (REGLAN) 5 MG tablet Take 1 tablet (5 mg total) by mouth every 8 (eight) hours as needed for nausea. 01/31/16 01/30/17  Loney Hering, MD  Multiple Vitamin (MULTIVITAMIN WITH MINERALS) TABS tablet Take 1 tablet by mouth daily. Patient not taking: Reported on 10/18/2019 12/13/15   Philipp Ovens, MD  ondansetron (ZOFRAN ODT) 4 MG disintegrating tablet Take 1 tablet (4 mg total) by mouth every 8 (eight) hours as  needed. Patient not taking: Reported on 10/18/2019 01/27/18   Menshew, Charlesetta Ivory, PA-C  predniSONE (STERAPRED UNI-PAK 21 TAB) 10 MG (21) TBPK tablet Take 6 tablets on day 1 Take 5 tablets on day 2 Take 4 tablets on day 3 Take 3 tablets on day 4 Take 2 tablets on day 5 Take 1 tablet on day 6 Patient not taking: Reported on 08/04/2019 03/26/18   Kem Boroughs B, FNP   risperiDONE (RISPERDAL) 1 MG tablet Take 1 tablet (1 mg total) by mouth 2 (two) times daily. 01/06/16 01/05/17  Phineas Semen, MD  sulfamethoxazole-trimethoprim (BACTRIM DS,SEPTRA DS) 800-160 MG tablet Take 1 tablet by mouth 2 (two) times daily. Patient not taking: Reported on 08/04/2019 03/19/16   Evangeline Dakin, PA-C  sulfamethoxazole-trimethoprim (BACTRIM DS,SEPTRA DS) 800-160 MG tablet Take 1 tablet by mouth 2 (two) times daily. Patient not taking: Reported on 08/04/2019 06/12/17   Rebecka Apley, MD     Prenatal care site: [facility]   Social History: She  reports that she has never smoked. She has never used smokeless tobacco. She reports that she does not drink alcohol or use drugs.  Family History: family history includes Thyroid disease in her mother.   Review of Systems: A full review of systems was performed and negative except as noted in the HPI.    Physical Exam:  Vital Signs: BP (!) 143/90   Pulse 82   Temp 98.3 F (36.8 C) (Oral)   Resp 18   Ht  (1.651 m)   Wt 77.1 kg   LMP 01/19/2019   SpO2 100%   BMI 28.29 kg/m   General:   alert, cooperative, appears stated age and no distress  Skin:  rash noted on abdomen, consistent with PUPPS  Neurologic:    Alert & oriented x 3  Lungs:   clear to auscultation bilaterally  Heart:   regular rate and rhythm, S1, S2 normal, no murmur, click, rub or gallop  Abdomen:  soft, non-tender; bowel sounds normal; no masses,  no organomegaly  Pelvis:  External genitalia: normal general appearance  FHT:  150 BPM  Presentations: cephalic  Cervix:    Dilation: 1cm   Effacement: 50%   Station:  -3   Consistency: medium   Position: anterior  Extremities: : non-tender, symmetric, +1 LE edema bilaterally.    EFW: 6lb 6oz  Results for orders placed or performed during the hospital encounter of 10/18/19 (from the past 24 hour(s))  Protein / creatinine ratio, urine     Status: None (Preliminary result)   Collection  Time: 10/18/19  8:33 PM  Result Value Ref Range   Creatinine, Urine 372 mg/dL   Total Protein, Urine PENDING mg/dL   Protein Creatinine Ratio PENDING 0.00 - 0.15 mg/mg[Cre]  Urine Drug Screen, Qualitative ([facility] only)     Status: None   Collection Time: 10/18/19  8:33 PM  Result Value Ref Range   Tricyclic, Ur Screen NONE DETECTED NONE DETECTED   Amphetamines, Ur Screen NONE DETECTED NONE DETECTED   MDMA (Ecstasy)Ur Screen NONE DETECTED NONE DETECTED   Cocaine Metabolite,Ur J[place]re NONE DETECTED NONE DETECTED   Opiate, Ur Screen NONE DETECTED NONE DETECTED   Phencyclidine (PCP) Ur S NONE DETECTED NONE DETECTED   Cannabinoid 50 Ng, Ur [place] NONE DETECTED NONE DETECTED   Barbiturates, Ur Screen NONE DETECTED NONE DETECTED   Benzodiazepine, Ur Scrn NONE DETECTED NONE DETECTED   Methadone Scn, Ur NONE DETECTED NONE DETECTED  Chlamydia/NGC rt PCR (  [facility] only)     Status: Abnormal   Collection Time: 10/18/19  8:33 PM   Specimen: Urine  Result Value Ref Range   Specimen source GC/Chlam URINE, RANDOM    Chlamydia Tr DETECTED (A) NOT DETECTED   N gonorrhoeae NOT DETECTED NOT DETECTED  Urinalysis, Routine w reflex microscopic     Status: Abnormal   Collection Time: 10/18/19  8:33 PM  Result Value Ref Range   Color, Urine AMBER (A) YELLOW   APPearance CLOUDY (A) CLEAR   Specific Gravity, Urine 1.030 1.005 - 1.030   pH 5.0 5.0 - 8.0   Glucose, UA NEGATIVE NEGATIVE mg/dL   Hgb urine dipstick NEGATIVE NEGATIVE   Bilirubin Urine NEGATIVE NEGATIVE   Ketones, ur NEGATIVE NEGATIVE mg/dL   Protein, ur 161 (A) NEGATIVE mg/dL   Nitrite NEGATIVE NEGATIVE   Leukocytes,Ua NEGATIVE NEGATIVE   RBC / HPF 0-5 0 - 5 RBC/hpf   WBC, UA 11-20 0 - 5 WBC/hpf   Bacteria, UA RARE (A) NONE SEEN   Squamous Epithelial / LPF 0-5 0 - 5   Mucus PRESENT   Type and screen [facility]     Status: None   Collection Time: 10/18/19  9:18 PM  Result Value Ref Range   ABO/RH(D) A POS    Antibody Screen NEG     Sample Expiration      10/21/2019,2359 Performed at [facility], [address] [place], [place] [address]   Comprehensive metabolic panel     Status: Abnormal   Collection Time: 10/18/19  9:18 PM  Result Value Ref Range   Sodium 138 135 - 145 mmol/L   Potassium 4.2 3.5 - 5.1 mmol/L   Chloride 110 98 - 111 mmol/L   CO2 16 (L) 22 - 32 mmol/L   Glucose, Bld 64 (L) 70 - 99 mg/dL   BUN 9 6 - 20 mg/dL   Creatinine, Ser 5.40 0.44 - 1.00 mg/dL   Calcium 8.5 (L) 8.9 - 10.3 mg/dL   Total Protein 6.1 (L) 6.5 - 8.1 g/dL   Albumin 2.7 (L) 3.5 - 5.0 g/dL   AST 19 15 - 41 U/L   ALT 12 0 - 44 U/L   Alkaline Phosphatase 144 (H) 38 - 126 U/L   Total Bilirubin 0.9 0.3 - 1.2 mg/dL   GFR calc non Af Amer >60 >60 mL/min   GFR calc Af Amer >60 >60 mL/min   Anion gap 12 5 - 15  CBC on admission     Status: Abnormal   Collection Time: 10/18/19  9:18 PM  Result Value Ref Range   WBC 12.6 (H) 4.0 - 10.5 K/uL   RBC 3.55 (L) 3.87 - 5.11 MIL/uL   Hemoglobin 9.9 (L) 12.0 - 15.0 g/dL   HCT 98.1 (L) 19.1 - 47.8 %   MCV 85.1 80.0 - 100.0 fL   MCH 27.9 26.0 - 34.0 pg   MCHC 32.8 30.0 - 36.0 g/dL   RDW 29.5 62.1 - 30.8 %   Platelets 127 (L) 150 - 400 K/uL   nRBC 0.0 0.0 - 0.2 %    Pertinent Results:  Prenatal Labs: Blood type/Rh A+  Antibody screen neg  Rubella Non-immune  Varicella Immune  RPR NR  HBsAg Neg  HIV NR  GC neg  Chlamydia neg  Genetic screening negative  1 hour GTT 91  3 hour GTT n/a  GBS negative   FHT: FHR: 140 bpm, variability: moderate,  accelerations:  Present,  decelerations:  Absent Category/reactivity:  Category I TOCO: regular, every 1-4 minutes   Assessment:  Linda Parks is a 18 y.o. G1P0 female at 6978w0d with elevated blood pressures, contractions.   Plan:  1. Admit to Labor & Delivery; consents reviewed and obtained  2. Fetal Well being  - Fetal Tracing: category I - GBS negative - Presentation: cephalic confirmed by Leopold's   3. Routine  OB: - Prenatal labs reviewed, as above - Rh positive - CBC & T&S on admit - Clear fluids, IVF  4. Augmentation of Labor: -  Contractions to be monitored with external toco in place -  Plan for augmentation with pitocin and foley balloon if possible -  Plan for continuous fetal monitoring  -  Maternal pain control as desired: IVPM, regional anesthesia - Anticipate vaginal delivery  5. Post Partum Planning: - Infant feeding: breastfeeding - Contraception: TBD  Genia DelMargaret Billiejean Schimek, CNM 10/18/2019 10:51 PM ----- Genia DelMargaret Lyrick [place] Certified Nurse Midwife [facility], Department of OB/GYN [facility]

## 2019-10-19 ENCOUNTER — Inpatient Hospital Stay: Payer: Medicaid Other | Admitting: Anesthesiology

## 2019-10-19 LAB — CBC WITH DIFFERENTIAL/PLATELET
Abs Immature Granulocytes: 0.1 10*3/uL — ABNORMAL HIGH (ref 0.00–0.07)
Basophils Absolute: 0.1 10*3/uL (ref 0.0–0.1)
Basophils Relative: 0 %
Eosinophils Absolute: 0.1 10*3/uL (ref 0.0–0.5)
Eosinophils Relative: 0 %
HCT: 32.7 % — ABNORMAL LOW (ref 36.0–46.0)
Hemoglobin: 10.5 g/dL — ABNORMAL LOW (ref 12.0–15.0)
Immature Granulocytes: 1 %
Lymphocytes Relative: 6 %
Lymphs Abs: 1 10*3/uL (ref 0.7–4.0)
MCH: 27.9 pg (ref 26.0–34.0)
MCHC: 32.1 g/dL (ref 30.0–36.0)
MCV: 87 fL (ref 80.0–100.0)
Monocytes Absolute: 1 10*3/uL (ref 0.1–1.0)
Monocytes Relative: 6 %
Neutro Abs: 14.4 10*3/uL — ABNORMAL HIGH (ref 1.7–7.7)
Neutrophils Relative %: 87 %
Platelets: 135 10*3/uL — ABNORMAL LOW (ref 150–400)
RBC: 3.76 MIL/uL — ABNORMAL LOW (ref 3.87–5.11)
RDW: 13.2 % (ref 11.5–15.5)
WBC: 16.6 10*3/uL — ABNORMAL HIGH (ref 4.0–10.5)
nRBC: 0 % (ref 0.0–0.2)

## 2019-10-19 LAB — COMPREHENSIVE METABOLIC PANEL
ALT: 13 U/L (ref 0–44)
AST: 20 U/L (ref 15–41)
Albumin: 2.7 g/dL — ABNORMAL LOW (ref 3.5–5.0)
Alkaline Phosphatase: 153 U/L — ABNORMAL HIGH (ref 38–126)
Anion gap: 16 — ABNORMAL HIGH (ref 5–15)
BUN: 13 mg/dL (ref 6–20)
CO2: 14 mmol/L — ABNORMAL LOW (ref 22–32)
Calcium: 8.7 mg/dL — ABNORMAL LOW (ref 8.9–10.3)
Chloride: 106 mmol/L (ref 98–111)
Creatinine, Ser: 1.31 mg/dL — ABNORMAL HIGH (ref 0.44–1.00)
GFR calc Af Amer: >60 mL/min (ref >60)
GFR calc non Af Amer: 59 mL/min — ABNORMAL LOW (ref >60)
Glucose, Bld: 58 mg/dL — ABNORMAL LOW (ref 70–99)
Potassium: 4.2 mmol/L (ref 3.5–5.1)
Sodium: 136 mmol/L (ref 135–145)
Total Bilirubin: 1.1 mg/dL (ref 0.3–1.2)
Total Protein: 6.5 g/dL (ref 6.5–8.1)

## 2019-10-19 LAB — SARS CORONAVIRUS 2 BY RT PCR (HOSPITAL ORDER, PERFORMED IN ~~LOC~~ HOSPITAL LAB): SARS Coronavirus 2: NEGATIVE

## 2019-10-19 LAB — RPR: RPR Ser Ql: NONREACTIVE

## 2019-10-19 MED ORDER — BUPIVACAINE HCL (PF) 0.25 % IJ SOLN
INTRAMUSCULAR | Status: DC | PRN
  Administered 2019-10-19: 3 mL via EPIDURAL
  Administered 2019-10-19: 4 mL via EPIDURAL

## 2019-10-19 MED ORDER — PHENYLEPHRINE 40 MCG/ML (10ML) SYRINGE FOR IV PUSH (FOR BLOOD PRESSURE SUPPORT): 80.0000 ug | PREFILLED_SYRINGE | INTRAVENOUS | Status: DC | PRN

## 2019-10-19 MED ORDER — EPHEDRINE 5 MG/ML INJ: 10.0000 mg | INTRAVENOUS | Status: DC | PRN

## 2019-10-19 MED ORDER — LIDOCAINE HCL (PF) 1 % IJ SOLN
INTRAMUSCULAR | Status: DC | PRN
  Administered 2019-10-19: 4 mL via EPIDURAL
  Administered 2019-10-19: 4 mL via INTRADERMAL

## 2019-10-19 MED ORDER — DIPHENHYDRAMINE HCL 50 MG/ML IJ SOLN: 12.5000 mg | INTRAMUSCULAR | Status: DC | PRN

## 2019-10-19 MED ORDER — FENTANYL 2.5 MCG/ML W/ROPIVACAINE 0.15% IN NS 100 ML EPIDURAL (ARMC)
12.0000 mL/h | EPIDURAL | Status: DC
  Administered 2019-10-20: 12 mL/h via EPIDURAL

## 2019-10-19 MED ORDER — MISOPROSTOL 200 MCG PO TABS
ORAL_TABLET | ORAL | Status: AC
  Filled 2019-10-19: qty 4

## 2019-10-19 MED ORDER — AMMONIA AROMATIC IN INHA
RESPIRATORY_TRACT | Status: AC
  Filled 2019-10-19: qty 10

## 2019-10-19 MED ORDER — FENTANYL 2.5 MCG/ML W/ROPIVACAINE 0.15% IN NS 100 ML EPIDURAL (ARMC)
12.0000 mL/h | EPIDURAL | Status: DC | PRN
  Filled 2019-10-19 (×2): qty 100

## 2019-10-19 MED ORDER — LIDOCAINE-EPINEPHRINE (PF) 1.5 %-1:200000 IJ SOLN
INTRAMUSCULAR | Status: DC | PRN
  Administered 2019-10-19: 4 mL via PERINEURAL

## 2019-10-19 MED ORDER — FENTANYL 2.5 MCG/ML W/ROPIVACAINE 0.15% IN NS 100 ML EPIDURAL (ARMC)
EPIDURAL | Status: DC | PRN
  Administered 2019-10-19: 12 mL/h via EPIDURAL

## 2019-10-19 MED ORDER — LACTATED RINGERS IV SOLN: 500.0000 mL | Freq: Once | INTRAVENOUS | Status: DC

## 2019-10-19 MED ORDER — FENTANYL 2.5 MCG/ML W/ROPIVACAINE 0.15% IN NS 100 ML EPIDURAL (ARMC)
EPIDURAL | Status: AC
  Filled 2019-10-19: qty 100

## 2019-10-19 MED ORDER — AZITHROMYCIN 500 MG PO TABS
1000.0000 mg | ORAL_TABLET | Freq: Once | ORAL | Status: AC
  Administered 2019-10-19: 10:00:00 1000 mg via ORAL
  Filled 2019-10-19 (×2): qty 2

## 2019-10-19 NOTE — Progress Notes (Signed)
1g po azithromycin ordered for positive chlamydia in urine on admission. Prior treatment at 36 weeks.

## 2019-10-19 NOTE — Progress Notes (Signed)
Labor Progress Note  Linda Parks is a 18 y.o. G1P0 at [redacted]w[redacted]d by ultrasound admitted for induction of labor due to preeclampsia without severe features.  Subjective:  Feeling regular contractions, reports that they are stronger.  Objective: BP 138/82   Pulse 99   Temp 98.1 F (36.7 C) (Oral)   Resp 16   Ht 5\' 5"  (1.651 m)   Wt 77.1 kg   LMP 01/19/2019   SpO2 100%   BMI 28.29 kg/m  Notable VS details: normal to mild range BPs  Fetal Assessment: FHT:  FHR: 135 bpm, variability: minimal, moderate,  accelerations:  Present,  decelerations:  Absent Category/reactivity:  Category II UC:   regular, every 2-3 minutes SVE:  Deferred, foley bulb still in place Membrane status: intact Amniotic color: n/a  Labs: Lab Results  Component Value Date   WBC 12.6 (H) 10/18/2019   HGB 9.9 (L) 10/18/2019   HCT 30.2 (L) 10/18/2019   MCV 85.1 10/18/2019   PLT 127 (L) 10/18/2019    Assessment / Plan: Induction of labor due to preeclampsia without severe features  Labor: Foley bulb still in place, pitocin started at 0523 and currently infusing at 42mu/min, titrate per protocol to adequate contraction pattern Preeclampsia:  no signs of severe features Fetal Wellbeing:  Category II Pain Control:  Maternal pain control as desired: IVPM, regional anesthesia Anticipated MOD:  NSVD  Lisette Grinder, CNM 10/19/2019, 7:34 AM

## 2019-10-19 NOTE — Progress Notes (Signed)
Linda Parks is a 18 y.o. G1P0 at [redacted]w[redacted]d   Subjective: Comfortable with epidural  Objective: BP 99/75   Pulse (!) 126   Temp 99.8 F (37.7 C) (Oral)   Resp 16   Ht 5\' 5"  (1.651 m)   Wt 77.1 kg   LMP 01/19/2019   SpO2 98%   BMI 28.29 kg/m  No intake/output data recorded. No intake/output data recorded.  FHT:  FHR: 140 bpm, variability: moderate,  accelerations:  Present,  decelerations:  Absent UC:   regular, every 2-3 minutes, pitocin SVE:   Dilation: 4 Effacement (%): 50 Station: Ballotable Exam by:: [employer]: Lab Results  Component Value Date   WBC 16.6 (H) 10/19/2019   HGB 10.5 (L) 10/19/2019   HCT 32.7 (L) 10/19/2019   MCV 87.0 10/19/2019   PLT 135 (L) 10/19/2019    Assessment / Plan: Induction of labor due to preeclampsia without severe features, BP wnl currently - Elevated oral temp to 99.8 and maternal tachycardia since admission. Will continue to monitor. Fluid without odor on AROM. No fetal tachycardia  Labor: AROM for clear fluid Preeclampsia:  no signs or symptoms of toxicity Fetal Wellbeing:  Category I Pain Control:  IV pain meds I/D:  S/p azithromycin 1000mg  po Anticipated MOD:  NSVD  Benjaman Kindler 10/19/2019, 9:42 PM

## 2019-10-19 NOTE — Anesthesia Procedure Notes (Signed)
Epidural Patient location during procedure: OB  Staffing Performed: anesthesiologist   Preanesthetic Checklist Completed: patient identified, site marked, surgical consent, pre-op evaluation, timeout performed, IV checked, risks and benefits discussed and monitors and equipment checked  Epidural Patient position: sitting Prep: Betadine Patient monitoring: heart rate, continuous pulse ox and blood pressure Approach: midline Location: L4-L5 Injection technique: LOR saline  Needle:  Needle type: Tuohy  Needle gauge: 17 G Needle length: 9 cm and 9 Needle insertion depth: 5 cm Catheter type: closed end flexible Catheter size: 19 Gauge Catheter at skin depth: 11 cm Test dose: negative and 1.5% lidocaine with Epi 1:200 K  Assessment Sensory level: T10 Events: blood not aspirated, injection not painful, no injection resistance, negative IV test and no paresthesia  Additional Notes   Patient tolerated the insertion well without complications.-SATD -IVTD. No paresthesia. Refer to [facility] nursing for VS and dosingReason for block:procedure for pain     

## 2019-10-19 NOTE — Anesthesia Preprocedure Evaluation (Signed)
Anesthesia Evaluation  Patient identified by MRN, date of birth, ID band Patient awake    Reviewed: Allergy & Precautions, H&P , NPO status , Patient's Chart, lab work & pertinent test results, reviewed documented beta blocker date and time   Airway Mallampati: II  TM Distance: >3 FB Neck ROM: full    Dental no notable dental hx. (+) Teeth Intact   Pulmonary neg pulmonary ROS, Current Smoker,    Pulmonary exam normal breath sounds clear to auscultation       Cardiovascular Exercise Tolerance: Good negative cardio ROS   Rhythm:regular Rate:Normal     Neuro/Psych Anxiety Depression Bipolar Disorder negative neurological ROS  negative psych ROS   GI/Hepatic negative GI ROS, Neg liver ROS,   Endo/Other  negative endocrine ROSdiabetes, Well Controlled, Gestational  Renal/GU      Musculoskeletal   Abdominal   Peds  Hematology negative hematology ROS (+)   Anesthesia Other Findings   Reproductive/Obstetrics (+) Pregnancy                             Anesthesia Physical Anesthesia Plan  ASA: II  Anesthesia Plan: Epidural   Post-op Pain Management:    Induction:   PONV Risk Score and Plan:   Airway Management Planned:   Additional Equipment:   Intra-op Plan:   Post-operative Plan:   Informed Consent: I have reviewed the patients History and Physical, chart, labs and discussed the procedure including the risks, benefits and alternatives for the proposed anesthesia with the patient or authorized representative who has indicated his/her understanding and acceptance.       Plan Discussed with:   Anesthesia Plan Comments:         Anesthesia Quick Evaluation

## 2019-10-19 NOTE — Progress Notes (Signed)
Linda Parks is a 18 y.o. G1P0 at [redacted]w[redacted]d admitted for induction of labor due to Hypertension.  Subjective: Comfortable, having breakfast - just received stadol Requests delayed cord clamping  Objective: BP 138/82   Pulse 99   Temp 98.1 F (36.7 C) (Oral)   Resp 16   Ht 5\' 5"  (1.651 m)   Wt 77.1 kg   LMP 01/19/2019   SpO2 100%   BMI 28.29 kg/m  No intake/output data recorded. No intake/output data recorded.  FHT:  FHR: 145 bpm, variability: minimal ,  accelerations:  absent,  decelerations:  Absent UC:   regular, every 3-4 minutes, pitocin at 3mu SVE:   Dilation: 1 Effacement (%): 60 Station: -3 Exam by:: Lisette Grinder, CNM  Labs: Lab Results  Component Value Date   WBC 12.6 (H) 10/18/2019   HGB 9.9 (L) 10/18/2019   HCT 30.2 (L) 10/18/2019   MCV 85.1 10/18/2019   PLT 127 (L) 10/18/2019    Assessment / Plan: Induction of labor due to preeclampsia without severe features,  progressing well on pitocin and with foley bulb in place  Labor: Progressing normally Preeclampsia:  no signs or symptoms of toxicity, intake and ouput balanced and labs stable Fetal Wellbeing:  Category II Pain Control:  stadol I/D:  n/a Anticipated MOD:  NSVD  Benjaman Kindler 10/19/2019, 8:30 AM

## 2019-10-19 NOTE — Progress Notes (Signed)
Linda Parks is a 18 y.o. G1P0 at [redacted]w[redacted]d   Subjective: Feeling more, still mild contractions  Objective: BP 129/80   Pulse 95   Temp 98 F (36.7 C) (Oral)   Resp 16   Ht 5\' 5"  (1.651 m)   Wt 77.1 kg   LMP 01/19/2019   SpO2 100%   BMI 28.29 kg/m  No intake/output data recorded. No intake/output data recorded.  FHT:  FHR: 145 bpm, variability: moderate,  accelerations:  Absent,  decelerations:  Absent UC:   regular, every 2-3 minutes, pitocin SVE:   Dilation: 4 Effacement (%): 60 Station: -2 Exam by:: Robin Searing RN  Labs: Lab Results  Component Value Date   WBC 12.6 (H) 10/18/2019   HGB 9.9 (L) 10/18/2019   HCT 30.2 (L) 10/18/2019   MCV 85.1 10/18/2019   PLT 127 (L) 10/18/2019    Assessment / Plan: Induction of labor due to preeclampsia,  progressing well on pitocin  Labor: Progressing on Pitocin, will continue to increase then AROM Preeclampsia:  no signs or symptoms of toxicity Fetal Wellbeing:  Category I Pain Control:  IV pain meds I/D:  S/p azithromycin 1000mg  po Anticipated MOD:  NSVD  Benjaman Kindler 10/19/2019, 1:09 PM

## 2019-10-19 NOTE — Progress Notes (Signed)
Labor Progress Note  Linda Parks is a 18 y.o. G1P0 at [redacted]w[redacted]d by ultrasound admitted for induction of labor due to preeclampsia without severe features.  Subjective:  Feeling regular contractions.  Objective: BP (!) 140/92   Pulse 81   Temp 98.7 F (37.1 C) (Oral)   Resp 14   Ht 5\' 5"  (1.651 m)   Wt 77.1 kg   LMP 01/19/2019   SpO2 100%   BMI 28.29 kg/m  Notable VS details: normal to mild range BPs  Fetal Assessment: FHT:  FHR: 125 bpm, variability: moderate,  accelerations:  Present,  decelerations:  Absent Category/reactivity:  Category I UC:   regular, every 1-4 minutes SVE:    Dilation: 1cm  Effacement: 60%  Station:  -3  Consistency: medium  Position: anterior  Membrane status: intact Amniotic color: n/a  Labs: Lab Results  Component Value Date   WBC 12.6 (H) 10/18/2019   HGB 9.9 (L) 10/18/2019   HCT 30.2 (L) 10/18/2019   MCV 85.1 10/18/2019   PLT 127 (L) 10/18/2019    Assessment / Plan: Induction of labor due to preeclampsia without severe features  Labor: Foley bulb placed now, plan to start pitocin as needed for adequate contraction pattern Preeclampsia:  no signs of severe features Fetal Wellbeing:  Category I Pain Control:  Maternal pain control as desired: IVPM, regional anesthesia Anticipated MOD:  NSVD  Lisette Grinder, CNM 10/19/2019, 1:44 AM

## 2019-10-20 ENCOUNTER — Encounter
Admission: EM | Disposition: A | Discharge status: Home / Self Care | Payer: Self-pay | Source: Home / Self Care | Attending: Obstetrics and Gynecology

## 2019-10-20 LAB — COMPREHENSIVE METABOLIC PANEL
ALT: 12 U/L (ref 0–44)
AST: 16 U/L (ref 15–41)
Albumin: 2.2 g/dL — ABNORMAL LOW (ref 3.5–5.0)
Alkaline Phosphatase: 131 U/L — ABNORMAL HIGH (ref 38–126)
Anion gap: 12 (ref 5–15)
BUN: 15 mg/dL (ref 6–20)
CO2: 16 mmol/L — ABNORMAL LOW (ref 22–32)
Calcium: 8.2 mg/dL — ABNORMAL LOW (ref 8.9–10.3)
Chloride: 109 mmol/L (ref 98–111)
Creatinine, Ser: 1.53 mg/dL — ABNORMAL HIGH (ref 0.44–1.00)
GFR calc Af Amer: 57 mL/min — ABNORMAL LOW (ref >60)
GFR calc non Af Amer: 49 mL/min — ABNORMAL LOW (ref >60)
Glucose, Bld: 63 mg/dL — ABNORMAL LOW (ref 70–99)
Potassium: 3.9 mmol/L (ref 3.5–5.1)
Sodium: 137 mmol/L (ref 135–145)
Total Bilirubin: 1 mg/dL (ref 0.3–1.2)
Total Protein: 5.5 g/dL — ABNORMAL LOW (ref 6.5–8.1)

## 2019-10-20 LAB — CBC WITH DIFFERENTIAL/PLATELET
Abs Immature Granulocytes: 0.1 10*3/uL — ABNORMAL HIGH (ref 0.00–0.07)
Basophils Absolute: 0.1 10*3/uL (ref 0.0–0.1)
Basophils Relative: 0 %
Eosinophils Absolute: 0 10*3/uL (ref 0.0–0.5)
Eosinophils Relative: 0 %
HCT: 27.6 % — ABNORMAL LOW (ref 36.0–46.0)
Hemoglobin: 9 g/dL — ABNORMAL LOW (ref 12.0–15.0)
Immature Granulocytes: 1 %
Lymphocytes Relative: 6 %
Lymphs Abs: 0.9 10*3/uL (ref 0.7–4.0)
MCH: 28.6 pg (ref 26.0–34.0)
MCHC: 32.6 g/dL (ref 30.0–36.0)
MCV: 87.6 fL (ref 80.0–100.0)
Monocytes Absolute: 1 10*3/uL (ref 0.1–1.0)
Monocytes Relative: 7 %
Neutro Abs: 12.2 10*3/uL — ABNORMAL HIGH (ref 1.7–7.7)
Neutrophils Relative %: 86 %
Platelets: 109 10*3/uL — ABNORMAL LOW (ref 150–400)
RBC: 3.15 MIL/uL — ABNORMAL LOW (ref 3.87–5.11)
RDW: 13.2 % (ref 11.5–15.5)
WBC: 14.4 10*3/uL — ABNORMAL HIGH (ref 4.0–10.5)
nRBC: 0 % (ref 0.0–0.2)

## 2019-10-20 LAB — PROTEIN / CREATININE RATIO, URINE
Creatinine, Urine: 460 mg/dL
Protein Creatinine Ratio: 1.47 mg/mg{Cre} — ABNORMAL HIGH (ref 0.00–0.15)
Total Protein, Urine: 676 mg/dL

## 2019-10-20 SURGERY — Surgical Case
Anesthesia: Epidural

## 2019-10-20 MED ORDER — LIDOCAINE 2% (20 MG/ML) 5 ML SYRINGE
INTRAMUSCULAR | Status: DC | PRN
  Administered 2019-10-20 (×4): 100 mg via INTRAVENOUS
  Administered 2019-10-20: 60 mg via INTRAVENOUS
  Administered 2019-10-20: 80 mg via INTRAVENOUS

## 2019-10-20 MED ORDER — PHENYLEPHRINE HCL (PRESSORS) 10 MG/ML IV SOLN
INTRAVENOUS | Status: DC | PRN
  Administered 2019-10-20 (×2): 100 ug via INTRAVENOUS

## 2019-10-20 MED ORDER — MAGNESIUM SULFATE 40 GM/1000ML IV SOLN
INTRAVENOUS | Status: AC
  Administered 2019-10-20: 4 g via INTRAVENOUS
  Filled 2019-10-20: qty 1000

## 2019-10-20 MED ORDER — OXYCODONE HCL 5 MG PO TABS
5.0000 mg | ORAL_TABLET | ORAL | Status: DC | PRN
  Administered 2019-10-20 – 2019-10-22 (×3): 5 mg via ORAL
  Filled 2019-10-20 (×3): qty 1

## 2019-10-20 MED ORDER — SODIUM CHLORIDE 0.9 % IV SOLN
INTRAVENOUS | Status: DC | PRN
  Administered 2019-10-20: 25 ug/min via INTRAVENOUS

## 2019-10-20 MED ORDER — OXYTOCIN 40 UNITS IN NORMAL SALINE INFUSION - SIMPLE MED
2.5000 [IU]/h | INTRAVENOUS | Status: DC
  Administered 2019-10-20 (×2): 2.5 [IU]/h via INTRAVENOUS
  Filled 2019-10-20: qty 1000

## 2019-10-20 MED ORDER — DIBUCAINE (PERIANAL) 1 % EX OINT: 1.0000 "application " | TOPICAL_OINTMENT | CUTANEOUS | Status: DC | PRN

## 2019-10-20 MED ORDER — MAGNESIUM SULFATE BOLUS VIA INFUSION
4.0000 g | Freq: Once | INTRAVENOUS | Status: AC
  Administered 2019-10-20: 09:00:00 4 g via INTRAVENOUS
  Filled 2019-10-20: qty 1000

## 2019-10-20 MED ORDER — LACTATED RINGERS IV SOLN
INTRAVENOUS | Status: DC
  Administered 2019-10-20: 12:00:00 via INTRAVENOUS

## 2019-10-20 MED ORDER — OXYCODONE HCL 5 MG PO TABS
10.0000 mg | ORAL_TABLET | ORAL | Status: DC | PRN
  Administered 2019-10-20: 17:00:00 10 mg via ORAL
  Filled 2019-10-20: qty 2

## 2019-10-20 MED ORDER — MENTHOL 3 MG MT LOZG
1.0000 | LOZENGE | OROMUCOSAL | Status: DC | PRN
  Filled 2019-10-20: qty 9

## 2019-10-20 MED ORDER — CALCIUM GLUCONATE 10 % IV SOLN
INTRAVENOUS | Status: AC
  Filled 2019-10-20: qty 10

## 2019-10-20 MED ORDER — MAGNESIUM SULFATE 40 GM/1000ML IV SOLN
2.0000 g/h | INTRAVENOUS | Status: DC
  Administered 2019-10-21: 2 g/h via INTRAVENOUS
  Filled 2019-10-20: qty 1000

## 2019-10-20 MED ORDER — MORPHINE SULFATE (PF) 0.5 MG/ML IJ SOLN
INTRAMUSCULAR | Status: AC
  Filled 2019-10-20: qty 10

## 2019-10-20 MED ORDER — SIMETHICONE 80 MG PO CHEW
80.0000 mg | CHEWABLE_TABLET | Freq: Three times a day (TID) | ORAL | Status: DC
  Administered 2019-10-20 – 2019-10-22 (×7): 80 mg via ORAL
  Filled 2019-10-20 (×7): qty 1

## 2019-10-20 MED ORDER — FENTANYL CITRATE (PF) 100 MCG/2ML IJ SOLN
INTRAMUSCULAR | Status: AC
  Filled 2019-10-20: qty 2

## 2019-10-20 MED ORDER — KETOROLAC TROMETHAMINE 30 MG/ML IJ SOLN: 30.0000 mg | Freq: Four times a day (QID) | INTRAMUSCULAR | Status: DC

## 2019-10-20 MED ORDER — COCONUT OIL OIL: 1.0000 "application " | TOPICAL_OIL | Status: DC | PRN

## 2019-10-20 MED ORDER — SODIUM CHLORIDE (PF) 0.9 % IJ SOLN
INTRAMUSCULAR | Status: AC
  Filled 2019-10-20: qty 50

## 2019-10-20 MED ORDER — NALBUPHINE HCL 10 MG/ML IJ SOLN: 5.0000 mg | INTRAMUSCULAR | Status: DC | PRN

## 2019-10-20 MED ORDER — WITCH HAZEL-GLYCERIN EX PADS: 1.0000 "application " | MEDICATED_PAD | CUTANEOUS | Status: DC | PRN

## 2019-10-20 MED ORDER — FENTANYL CITRATE (PF) 100 MCG/2ML IJ SOLN
INTRAMUSCULAR | Status: DC | PRN
  Administered 2019-10-20: 100 ug via EPIDURAL

## 2019-10-20 MED ORDER — ZOLPIDEM TARTRATE 5 MG PO TABS: 5.0000 mg | ORAL_TABLET | Freq: Every evening | ORAL | Status: DC | PRN

## 2019-10-20 MED ORDER — PRENATAL MULTIVITAMIN CH
1.0000 | ORAL_TABLET | Freq: Every day | ORAL | Status: DC
  Administered 2019-10-20 – 2019-10-22 (×3): 1 via ORAL
  Filled 2019-10-20 (×3): qty 1

## 2019-10-20 MED ORDER — METHYLERGONOVINE MALEATE 0.2 MG/ML IJ SOLN
INTRAMUSCULAR | Status: AC
  Filled 2019-10-20: qty 1

## 2019-10-20 MED ORDER — SODIUM CHLORIDE 0.9 % IV SOLN
500.0000 mg | INTRAVENOUS | Status: DC
  Administered 2019-10-20: 500 mg via INTRAVENOUS
  Filled 2019-10-20 (×2): qty 500

## 2019-10-20 MED ORDER — MORPHINE SULFATE (PF) 0.5 MG/ML IJ SOLN
INTRAMUSCULAR | Status: DC | PRN
  Administered 2019-10-20: 2 mg via EPIDURAL

## 2019-10-20 MED ORDER — SENNOSIDES-DOCUSATE SODIUM 8.6-50 MG PO TABS
2.0000 | ORAL_TABLET | ORAL | Status: DC
  Administered 2019-10-21 – 2019-10-22 (×2): 2 via ORAL
  Filled 2019-10-20 (×2): qty 2

## 2019-10-20 MED ORDER — SIMETHICONE 80 MG PO CHEW: 80.0000 mg | CHEWABLE_TABLET | ORAL | Status: DC

## 2019-10-20 MED ORDER — BUPIVACAINE LIPOSOME 1.3 % IJ SUSP
20.0000 mL | Freq: Once | INTRAMUSCULAR | Status: DC
  Filled 2019-10-20: qty 20

## 2019-10-20 MED ORDER — LABETALOL HCL 5 MG/ML IV SOLN: 20.0000 mg | INTRAVENOUS | Status: DC | PRN

## 2019-10-20 MED ORDER — SIMETHICONE 80 MG PO CHEW: 80.0000 mg | CHEWABLE_TABLET | ORAL | Status: DC | PRN

## 2019-10-20 MED ORDER — CARBOPROST TROMETHAMINE 250 MCG/ML IM SOLN
INTRAMUSCULAR | Status: AC
  Filled 2019-10-20: qty 1

## 2019-10-20 MED ORDER — LACTATED RINGERS IV BOLUS
500.0000 mL | Freq: Once | INTRAVENOUS | Status: AC
  Administered 2019-10-20: 500 mL via INTRAVENOUS

## 2019-10-20 MED ORDER — GABAPENTIN 300 MG PO CAPS
300.0000 mg | ORAL_CAPSULE | Freq: Every day | ORAL | Status: DC
  Administered 2019-10-20 – 2019-10-21 (×2): 300 mg via ORAL
  Filled 2019-10-20 (×2): qty 1

## 2019-10-20 MED ORDER — BUPIVACAINE HCL (PF) 0.5 % IJ SOLN
INTRAMUSCULAR | Status: AC
  Filled 2019-10-20: qty 30

## 2019-10-20 MED ORDER — OXYTOCIN 40 UNITS IN NORMAL SALINE INFUSION - SIMPLE MED
INTRAVENOUS | Status: DC | PRN
  Administered 2019-10-20: 500 mL via INTRAVENOUS

## 2019-10-20 MED ORDER — ACETAMINOPHEN 500 MG PO TABS
1000.0000 mg | ORAL_TABLET | Freq: Four times a day (QID) | ORAL | Status: DC
  Administered 2019-10-20 – 2019-10-22 (×7): 1000 mg via ORAL
  Filled 2019-10-20 (×8): qty 2

## 2019-10-20 MED ORDER — BUPIVACAINE LIPOSOME 1.3 % IJ SUSP
INTRAMUSCULAR | Status: DC | PRN
  Administered 2019-10-20: 80 mL

## 2019-10-20 MED ORDER — CEFAZOLIN SODIUM-DEXTROSE 2-4 GM/100ML-% IV SOLN
2.0000 g | Freq: Once | INTRAVENOUS | Status: AC
  Administered 2019-10-20: 2 g via INTRAVENOUS
  Filled 2019-10-20: qty 100

## 2019-10-20 SURGICAL SUPPLY — 28 items
BARRIER ADHS 3X4 INTERCEED (GAUZE/BANDAGES/DRESSINGS) ×3 IMPLANT
CANISTER SUCT 3000ML PPV (MISCELLANEOUS) ×3 IMPLANT
CHLORAPREP W/TINT 26 (MISCELLANEOUS) ×3 IMPLANT
COVER WAND RF STERILE (DRAPES) ×3 IMPLANT
DRSG TELFA 3X8 NADH (GAUZE/BANDAGES/DRESSINGS) ×3 IMPLANT
ELECT CAUTERY BLADE 6.4 (BLADE) ×3 IMPLANT
ELECT REM PT RETURN 9FT ADLT (ELECTROSURGICAL) ×3
ELECTRODE REM PT RTRN 9FT ADLT (ELECTROSURGICAL) ×1 IMPLANT
GAUZE SPONGE 4X4 12PLY STRL (GAUZE/BANDAGES/DRESSINGS) ×3 IMPLANT
GLOVE BIO SURGEON STRL SZ8 (GLOVE) ×3 IMPLANT
GOWN STRL REUS W/ TWL LRG LVL3 (GOWN DISPOSABLE) ×2 IMPLANT
GOWN STRL REUS W/ TWL XL LVL3 (GOWN DISPOSABLE) ×1 IMPLANT
GOWN STRL REUS W/TWL LRG LVL3 (GOWN DISPOSABLE) ×4
GOWN STRL REUS W/TWL XL LVL3 (GOWN DISPOSABLE) ×2
NS IRRIG 1000ML POUR BTL (IV SOLUTION) ×3 IMPLANT
PACK C SECTION AR (MISCELLANEOUS) ×3 IMPLANT
PAD DRESSING TELFA 3X8 NADH (GAUZE/BANDAGES/DRESSINGS) ×1 IMPLANT
PAD OB MATERNITY 4.3X12.25 (PERSONAL CARE ITEMS) ×3 IMPLANT
PAD PREP 24X41 OB/GYN DISP (PERSONAL CARE ITEMS) ×3 IMPLANT
PENCIL SMOKE ULTRAEVAC 22 CON (MISCELLANEOUS) ×3 IMPLANT
RETRACTOR TRAXI PANNICULUS (MISCELLANEOUS) IMPLANT
STAPLER INSORB 30 2030 C-SECTI (MISCELLANEOUS) ×2 IMPLANT
STRAP SAFETY 5IN WIDE (MISCELLANEOUS) ×3 IMPLANT
SUCT VACUUM KIWI BELL (SUCTIONS) IMPLANT
SUT CHROMIC 1 CTX 36 (SUTURE) ×9 IMPLANT
SUT PLAIN GUT 0 (SUTURE) ×6 IMPLANT
SUT VIC AB 0 CT1 36 (SUTURE) ×6 IMPLANT
TRAXI PANNICULUS RETRACTOR (MISCELLANEOUS)

## 2019-10-20 NOTE — Progress Notes (Signed)
Patient ID: Linda Parks, female   DOB: 2000/12/07, 19 y.o.   MRN: [id number] E check no cervical change .  Active phase arrest   Pt has been counseled regarding the indication of the procedure . All questions answered .consent signed  Proceed

## 2019-10-20 NOTE — Progress Notes (Signed)
Linda Parks is a 18 y.o. G1P0 at [redacted]w[redacted]d by   Subjective: Pitocin at 30 mu/min  cx at 7 cm now since 2 am  BP initially elevated on admission , but normal range over night . + proteinuria No cns symptoms  Decreased urine output  75 cc  Since 2400. Fluid bolus 1000 at 2000 yest and 500 cc at 2400  Objective: BP 124/72   Pulse (!) 102   Temp 99 F (37.2 C) (Oral)   Resp 14   Ht 5\' 5"  (1.651 m)   Wt 77.1 kg   LMP 01/19/2019   SpO2 95%   BMI 28.29 kg/m  I/O last 3 completed shifts: In: 4788.4 [I.V.:4788.4] Out: 55 [Urine:55] Total I/O In: -  Out: 30 [Urine:30]  FHT:  FHR: 140 bpm, variability: moderate,  accelerations:  Present,  decelerations:  Absent adequate MVU UC:   regular, every 3 minutes SVE:   Dilation: 7 Effacement (%): 80 Station: 0 Exam by:: Tiffannie Sloss, MD  Labs: Lab Results  Component Value Date   WBC 14.4 (H) 10/20/2019   HGB 9.0 (L) 10/20/2019   HCT 27.6 (L) 10/20/2019   MCV 87.6 10/20/2019   PLT 109 (L) 10/20/2019    Assessment / Plan: Protracted active phase Decreased urine output   Initial elevated bp 24 hrs ago with proteinuria . Currently normotensive and without CNS symptoms .  My exam reveals narrow arch  500 cc fluid bolus and record output   recheck in one hour and if no change move to a c/s    Linda Parks 10/20/2019, 8:29 AM

## 2019-10-20 NOTE — Progress Notes (Signed)
Patient ID: Linda Parks, female   DOB: [birth year], 18 y.o.   MRN: [id number] BP  140/90  Coupled with proteinuria and decreased urine output and hyperreflexia I will start magnesium for sz prophylaxis

## 2019-10-20 NOTE — Discharge Summary (Signed)
Obstetrical Discharge Summary  Patient Name: Linda Parks DOB: Oct 27, 2001 MRN: [id number]  Date of Admission: 10/18/2019 Date of Delivery:10/20/2019 Delivered by: Huel Cote MD Date of Discharge: 10/26/2019  Primary OB: [facility] CNO:[id number] last menstrual period was 01/19/2019. EDC Estimated Date of Delivery: 11/01/19 Gestational Age at Delivery: [redacted]w[redacted]d   Antepartum complications: chlamydia , MJ use, Rubella non immune , gestational thrombocytopenia , elevated BP   Admitting Diagnosis: htn , rash , ctx Secondary Diagnosis: Patient Active Problem List   Diagnosis Date Noted  . Rash 10/18/2019  . Elevated blood pressure affecting pregnancy in third trimester, antepartum 10/18/2019  . Abdominal pain in pregnancy, third trimester 08/04/2019  . Pregnancy 08/04/2019  . Supervision of low-risk pregnancy 03/23/2019  . Decrease in appetite   . Bipolar 1 disorder, depressed, moderate ([place]) 12/02/2015  . Major depressive episode 09/07/2015  . Social anxiety disorder 09/07/2015  . Panic attacks 09/07/2015    Augmentation: AROM and Pitocin Complications: Intrapartum complications/course: preeclampsia , severe features with elevated creatinine , thrombocytopenia, oliguria  . Active phase arrest due to Cephalopelvic disproportion. Magnesium started HD#2  At 1000 on 10/20/19 , LTCS done at 1059 .   Date of Delivery: 10/26/2019  Delivered By: Huel Cote MD Delivery Type: primary cesarean section, low transverse incision Anesthesia: epidural Placenta: spontaneous Laceration:  Episiotomy: none Newborn Data: Live born female  Birth Weight: 7 lb 1.2 oz (3210 g) APGAR: 8, 9  Newborn Delivery   Birth date/time: 10/20/2019 10:56:00 Delivery type: C-Section, Low Transverse Trial of labor: Yes C-section categorization: Primary        Postpartum Procedures: none  Post partum course: Pt remained on Magnesium PP .  Patient had an uncomplicated postpartum course.  By time  of discharge on POD# 2, her pain was controlled on oral pain medications; she had appropriate lochia and was ambulating, voiding without difficulty, tolerating regular diet and passing flatus.   She was deemed stable for discharge to home.    Discharge Physical Exam:  BP 136/83 (BP Location: Left Arm)   Pulse 77   Temp 98.6 F (37 C) (Oral)   Resp 18   Ht 5\' 5"  (1.651 m)   Wt 77.1 kg   LMP 01/19/2019   SpO2 98%   Breastfeeding Unknown   BMI 28.29 kg/m   General: NAD CV: RRR Pulm: CTABL, nl effort ABD: s/nd/nt, fundus firm and below the umbilicus Lochia: moderate Incision: c/d/i DVT Evaluation: LE non-ttp, no evidence of DVT on exam.  Hemoglobin  Date Value Ref Range Status  10/22/2019 7.7 (L) 12.0 - 15.0 g/dL Final   HCT  Date Value Ref Range Status  10/22/2019 22.9 (L) 36.0 - 46.0 % Final     Disposition: stable, discharge to home. Baby Feeding: breastmilk Baby Disposition: home with mom  Rh Immune globulin given:  Rubella vaccine given: ordered to be given on d/c  Tdap vaccine given in AP or PP setting: given prenatal Flu vaccine given in AP or PP setting: given prenatal   Contraception: unknown  Prenatal Labs:  Blood type/Rh A+  Antibody screen neg  Rubella Non-immune  Varicella Immune  RPR NR  HBsAg Neg  HIV NR  GC neg  Chlamydia neg  Genetic screening negative  1 hour GTT 91  3 hour GTT n/a  GBS negative      Plan:  Linda Parks was discharged to home in good condition. Follow-up appointment with delivering provider in 6 weeks.  Discharge Medications: Allergies as of 10/22/2019  Reactions   Amoxicillin    Yeast infections   Zofran [ondansetron Hcl]       Medication List    ASK your doctor about these medications   clotrimazole-betamethasone cream Commonly known as: Lotrisone Apply 1 application topically 2 (two) times daily.   cyproheptadine 4 MG tablet Commonly known as: PERIACTIN Take 0.5 tablets (2 mg total) by mouth 2  (two) times daily as needed (decrease appetite). Only take if patient continues with decreased appetite and loosing weight.   diphenhydrAMINE 25 mg capsule Commonly known as: BENADRYL Take 1 capsule (25 mg total) by mouth every 6 (six) hours as needed.   famotidine 40 MG tablet Commonly known as: PEPCID Take 1 tablet (40 mg total) by mouth every evening.   FLUoxetine 10 MG capsule Commonly known as: PROZAC Please give 20mg  tabs.  Take 1 and 1/2 tab by mouth after breakfast to make total daily dose of 30mg .   fluticasone 50 MCG/ACT nasal spray Commonly known as: Flonase Place 2 sprays into both nostrils daily.   hydrocortisone 2.5 % cream Apply 1 application topically 2 (two) times daily as needed (for itching).   ibuprofen 400 MG tablet Commonly known as: ADVIL Take 400 mg by mouth every 6 (six) hours as needed for cramping.   ibuprofen 800 MG tablet Commonly known as: ADVIL Take 1 tablet (800 mg total) by mouth every 8 (eight) hours as needed.   magic mouthwash w/lidocaine Soln Take 5 mLs by mouth 4 (four) times daily as needed for mouth pain.   metoCLOPramide 5 MG tablet Commonly known as: Reglan Take 1 tablet (5 mg total) by mouth every 8 (eight) hours as needed for nausea.   multivitamin with minerals Tabs tablet Take 1 tablet by mouth daily.   multivitamin-prenatal 27-0.8 MG Tabs tablet Take 1 tablet by mouth daily at 12 noon.   ondansetron 4 MG disintegrating tablet Commonly known as: Zofran ODT Take 1 tablet (4 mg total) by mouth every 8 (eight) hours as needed.   predniSONE 10 MG (21) Tbpk tablet Commonly known as: STERAPRED UNI-PAK 21 TAB Take 6 tablets on day 1 Take 5 tablets on day 2 Take 4 tablets on day 3 Take 3 tablets on day 4 Take 2 tablets on day 5 Take 1 tablet on day 6   risperiDONE 1 MG tablet Commonly known as: RisperDAL Take 1 tablet (1 mg total) by mouth 2 (two) times daily.   sulfamethoxazole-trimethoprim 800-160 MG tablet Commonly  known as: BACTRIM DS Take 1 tablet by mouth 2 (two) times daily.   sulfamethoxazole-trimethoprim 800-160 MG tablet Commonly known as: BACTRIM DS Take 1 tablet by mouth 2 (two) times daily.         Signed 10/26/19 6:24 AM

## 2019-10-20 NOTE — Brief Op Note (Addendum)
10/20/2019  11:36 AM  PATIENT:  Linda Parks  18 y.o. female  PRE-OPERATIVE DIAGNOSIS:  C SECTION Active phase arrest  POST-OPERATIVE DIAGNOSIS:  C SECTION Cephalopelvic disproportion  PROCEDURE:  Procedure(s): CESAREAN SECTION (N/A) Low transverse cesarean section  SURGEON:  Surgeon(s) and Role:    * Schermerhorn, Gwen Her, MD - Primary  PHYSICIAN ASSISTANT: Hassan Buckler , CNM   ASSISTANTS: none   ANESTHESIA:   epidural  EBL:  500 mL , measured 497 cc, IOF 1100 cc, UO 50 cc  BLOOD ADMINISTERED:none  DRAINS: Urinary Catheter (Foley)   LOCAL MEDICATIONS USED:  MARCAINE    and BUPIVICAINE   SPECIMEN:  No Specimen  DISPOSITION OF SPECIMEN:  N/A  COUNTS:  YES  TOURNIQUET:  * No tourniquets in log *  DICTATION: .Other Dictation: Dictation Number verbal  PLAN OF CARE: Admit to inpatient   PATIENT DISPOSITION:  PACU - hemodynamically stable.   Delay start of Pharmacological VTE agent (>24hrs) due to surgical blood loss or risk of bleeding: not applicable

## 2019-10-20 NOTE — Anesthesia Post-op Follow-up Note (Signed)
Anesthesia QCDR form completed.        

## 2019-10-20 NOTE — Op Note (Signed)
NAME: Linda Parks, Linda Parks MEDICAL RECORD BZ:[id number] ACCOUNT [id number] DATE OF BIRTH:May 14, 2001 FACILITY: [facility] LOCATION: [facility] PHYSICIAN:THOMAS Cloyde Reams, MD  OPERATIVE REPORT  DATE OF PROCEDURE:  10/20/2019  PREOPERATIVE DIAGNOSES: 1.  Active phase arrest. 2.  Preeclampsia,severe criteria   3.  Thirty-eight +2 weeks estimated gestational age.  POSTOPERATIVE DIAGNOSES: 1.  Active phase arrest. 2.  Cephalopelvic disproportion. 3.  Preeclampsia, severe criteria. 4.  Thirty-eight +2 weeks estimated gestational age. 5.  Vigorous female delivered.  PROCEDURE:  Primary low transverse cesarean section.  ANESTHESIA:  Surgical dosing of continuous lumbar epidural.  SURGEON:  Jennell Corner, MD.  FIRST ASSISTANT:  Heloise Ochoa, certified nurse midwife.  INDICATIONS:  This a 18 year old gravida 1, para 0 patient at 38+2 weeks, was admitted 2 days previous and labored, dilating her cervix to 7 cm and despite adequate Pitocin and adequate contraction pattern, the patient did not progress past 7 cm.  Pelvic  exam revealed a narrow arch consistent with cephalopelvic disproportion.  The patient was started on magnesium prior to surgery due to worsening mild preeclampsia.  DESCRIPTION OF PROCEDURE:  After adequate surgical dosing of continuous lumbar epidural, the patient was placed in dorsal supine position.  Hip roll was placed under the right side.  The patient's abdomen was prepped and draped in normal sterile fashion.   The patient did receive 2 g IV Ancef and 500 mg IV azithromycin for surgical prophylaxis.  Timeout was performed.  A Pfannenstiel incision was made 2 fingerbreadths above the symphysis pubis.  Sharp dissection was used to identify the fascia.  Fascia  was opened in the midline and opened in a transverse fashion.  The superior aspect of the fascia was grasped with Kocher clamps and the recti muscles were dissected free.  Inferior aspect of the fascia was grasped  with Kocher clamps and pyramidalis  muscles dissected free.  Entry into the peritoneal cavity was accomplished sharply.  The vesicouterine peritoneal fold was identified and bladder flap was created and the bladder was reflected inferiorly.  Low transverse uterine incision was made.  Upon  entry into the endometrial cavity, clear fluid resulted.  The incision was extended with blunt transverse traction.  An extremely wedged fetal head was elevated with operator's hand and head and shoulders were delivered without difficulty.  A vigorous  female was dried on the abdomen and delayed cord clamping was performed for 60 seconds.  A vigorous female was then passed to nursery staff who assigned Apgar scores of 8 and 9 and fetal weight of 3210 g.  Time of birth 1056 hours on 10/20/2019.  The  placenta was manually delivered and the uterus was exteriorized.  The endometrial cavity was wiped clean with laparotomy tape and the uterine incision was closed with a running #1 chromic suture in a locking fashion.  Good hemostasis was noted.   Fallopian tubes and ovaries appeared normal.  Posterior cul-de-sac was irrigated and suctioned.  Uterus was placed back into the abdominal cavity and the pericolic gutters were wiped clean with laparotomy tape.  Uterine incision again appeared  hemostatic.  Interceed was placed over the uterine incision in a T-shaped fashion.  The fascia was then closed with 0 Vicryl suture in a running nonlocking fashion.  Fascial edges were then injected with a solution of 20 mL of 1.3% Exparel, plus 30 mL of  0.5% Marcaine, plus 50 mL normal saline;  60 mL was injected in the fascial edges.  Subcutaneous tissues were irrigated and bovied for hemostasis.  The skin was reapproximated with Insorb absorbable staples.  Good cosmetic effect.  There were no  complications.  ESTIMATED BLOOD LOSS:  497 mL measured.  INTRAOPERATIVE FLUIDS:  1100 mL.  URINE OUTPUT:  50 mL.  DISPOSITION:  The patient was  taken to the recovery room in good condition.  VN/NUANCE  D:10/20/2019 T:10/20/2019 JOB:[id number]

## 2019-10-20 NOTE — Progress Notes (Signed)
Linda Parks is a 18 y.o. G1P0 at [redacted]w[redacted]d by ultrasound admitted for induction of labor due to Hypertension with normal labs. - maternal tachycardia intermittently since admission - Tmax 100.8, last 100.3 - WBC elevated on admission at 16.6, now 14.4 - low urine output: lungs clear, O2 sats 98% - no fetal tachycardia, + accels, periods of minimal variability   Subjective: Comfortable with epidural  Objective: BP 127/84   Pulse (!) 119   Temp 100.3 F (37.9 C) (Oral)   Resp 16   Ht 5\' 5"  (1.651 m)   Wt 77.1 kg   LMP 01/19/2019   SpO2 97%   BMI 28.29 kg/m  No intake/output data recorded. Total I/O In: 5 [Other:5] Out: -   FHT:  FHR: 140 bpm, variability: moderate,  accelerations:  Present,  decelerations:  Present intermittent late decels with prompt recovery UC:   regular, every q3 minutes SVE:   Dilation: 7 Effacement (%): 80 Station: -1 Exam by:: JDaley  Labs: Lab Results  Component Value Date   WBC 14.4 (H) 10/20/2019   HGB 9.0 (L) 10/20/2019   HCT 27.6 (L) 10/20/2019   MCV 87.6 10/20/2019   PLT 109 (L) 10/20/2019    Assessment / Plan:  Elevated BP, decreased urine output with normal BUN/Cr Maternal temp, no fundal tenderness, +tachycardia. Ddx includes intraamniotic infection, but WBC lower than admission and temp <101. Will continue to monitor. Fetal status: intermittent Cat II with clear fluid, currently Cat I  Cervical dilation: 7 cm, pitocin at 28 mu/min. MVUs 200-250  Benjaman Kindler 10/20/2019, 5:52 AM

## 2019-10-20 NOTE — Transfer of Care (Signed)
Immediate Anesthesia Transfer of Care Note  Patient: Linda Parks  Procedure(s) Performed: CESAREAN SECTION (N/A )  Patient Location: PACU  Anesthesia Type:Epidural  Level of Consciousness: awake, alert  and oriented  Airway & Oxygen Therapy: Patient Spontanous Breathing  Post-op Assessment: Post -op Vital signs reviewed and stable  Post vital signs: stable  Last Vitals:  Vitals Value Taken Time  BP 106/59 10/20/19 1137  Temp    Pulse 98 10/20/19 1137  Resp 12 10/20/19 1137  SpO2 98 % 10/20/19 1137    Last Pain:  Vitals:   10/20/19 0922  TempSrc: Oral  PainSc:       Patients Stated Pain Goal: 4 (26/20/35 5974)  Complications: No apparent anesthesia complications

## 2019-10-21 ENCOUNTER — Encounter: Payer: Self-pay | Admitting: Obstetrics and Gynecology

## 2019-10-21 LAB — COMPREHENSIVE METABOLIC PANEL
ALT: 8 U/L (ref 0–44)
AST: 14 U/L — ABNORMAL LOW (ref 15–41)
Albumin: 1.8 g/dL — ABNORMAL LOW (ref 3.5–5.0)
Alkaline Phosphatase: 107 U/L (ref 38–126)
Anion gap: 6 (ref 5–15)
BUN: 14 mg/dL (ref 6–20)
CO2: 19 mmol/L — ABNORMAL LOW (ref 22–32)
Calcium: 7.5 mg/dL — ABNORMAL LOW (ref 8.9–10.3)
Chloride: 109 mmol/L (ref 98–111)
Creatinine, Ser: 1.04 mg/dL — ABNORMAL HIGH (ref 0.44–1.00)
GFR calc Af Amer: >60 mL/min (ref >60)
GFR calc non Af Amer: >60 mL/min (ref >60)
Glucose, Bld: 119 mg/dL — ABNORMAL HIGH (ref 70–99)
Potassium: 3.6 mmol/L (ref 3.5–5.1)
Sodium: 134 mmol/L — ABNORMAL LOW (ref 135–145)
Total Bilirubin: 0.5 mg/dL (ref 0.3–1.2)
Total Protein: 4.6 g/dL — ABNORMAL LOW (ref 6.5–8.1)

## 2019-10-21 LAB — MAGNESIUM: Magnesium: 8.4 mg/dL (ref 1.7–2.4)

## 2019-10-21 LAB — CBC
HCT: 23.4 % — ABNORMAL LOW (ref 36.0–46.0)
Hemoglobin: 7.9 g/dL — ABNORMAL LOW (ref 12.0–15.0)
MCH: 28.2 pg (ref 26.0–34.0)
MCHC: 33.8 g/dL (ref 30.0–36.0)
MCV: 83.6 fL (ref 80.0–100.0)
Platelets: 111 10*3/uL — ABNORMAL LOW (ref 150–400)
RBC: 2.8 MIL/uL — ABNORMAL LOW (ref 3.87–5.11)
RDW: 13.1 % (ref 11.5–15.5)
WBC: 13.7 10*3/uL — ABNORMAL HIGH (ref 4.0–10.5)
nRBC: 0 % (ref 0.0–0.2)

## 2019-10-21 MED ORDER — DIPHENHYDRAMINE HCL 50 MG/ML IJ SOLN: 12.5000 mg | INTRAMUSCULAR | Status: DC | PRN

## 2019-10-21 MED ORDER — SODIUM CHLORIDE 0.9% FLUSH: 3.0000 mL | INTRAVENOUS | Status: DC | PRN

## 2019-10-21 MED ORDER — NALOXONE HCL 0.4 MG/ML IJ SOLN: 0.4000 mg | INTRAMUSCULAR | Status: DC | PRN

## 2019-10-21 MED ORDER — OXYCODONE HCL 5 MG PO TABS: 5.0000 mg | ORAL_TABLET | ORAL | Status: DC | PRN

## 2019-10-21 MED ORDER — MEASLES, MUMPS & RUBELLA VAC IJ SOLR
0.5000 mL | Freq: Once | INTRAMUSCULAR | Status: DC
  Filled 2019-10-21: qty 0.5

## 2019-10-21 MED ORDER — NALOXONE HCL 4 MG/10ML IJ SOLN
1.0000 ug/kg/h | INTRAVENOUS | Status: DC | PRN
  Filled 2019-10-21: qty 5

## 2019-10-21 MED ORDER — NALBUPHINE HCL 10 MG/ML IJ SOLN: 5.0000 mg | Freq: Once | INTRAMUSCULAR | Status: DC | PRN

## 2019-10-21 MED ORDER — ENOXAPARIN SODIUM 40 MG/0.4ML ~~LOC~~ SOLN
40.0000 mg | SUBCUTANEOUS | Status: DC
  Administered 2019-10-21: 40 mg via SUBCUTANEOUS
  Filled 2019-10-21: qty 0.4

## 2019-10-21 MED ORDER — MORPHINE SULFATE (PF) 2 MG/ML IV SOLN: 1.0000 mg | INTRAVENOUS | Status: DC | PRN

## 2019-10-21 MED ORDER — ACETAMINOPHEN 325 MG PO TABS: 650.0000 mg | ORAL_TABLET | Freq: Four times a day (QID) | ORAL | Status: DC

## 2019-10-21 MED ORDER — DIPHENHYDRAMINE HCL 25 MG PO CAPS: 25.0000 mg | ORAL_CAPSULE | Freq: Four times a day (QID) | ORAL | Status: DC | PRN

## 2019-10-21 MED ORDER — ONDANSETRON HCL 4 MG/2ML IJ SOLN: 4.0000 mg | Freq: Three times a day (TID) | INTRAMUSCULAR | Status: DC | PRN

## 2019-10-21 MED ORDER — DIPHENHYDRAMINE HCL 25 MG PO CAPS: 25.0000 mg | ORAL_CAPSULE | ORAL | Status: DC | PRN

## 2019-10-21 NOTE — Progress Notes (Signed)
Patient ID: Linda Parks, female   DOB: 20-Dec-2000, 18 y.o.   MRN: [id number]   Pt is eating dinner, told her I would come back to assess her

## 2019-10-21 NOTE — Anesthesia Post-op Follow-up Note (Signed)
  Anesthesia Pain Follow-up Note  Patient: Linda Parks  Day #: 1  Date of Follow-up: 10/21/2019 Time: 10:34 AM  Last Vitals:  Vitals:   10/21/19 1026 10/21/19 1027  BP:    Pulse: (!) 110 (!) 104  Resp:    Temp:  (!) 36.3 C  SpO2: 95% 96%    Level of Consciousness: alert  Pain: none   Side Effects:None  Catheter Site Exam:clean, dry, no drainage     Plan: D/C from anesthesia care at surgeon's request  Alison Stalling

## 2019-10-21 NOTE — Anesthesia Postprocedure Evaluation (Signed)
Anesthesia Post Note  Patient: Linda Parks  Procedure(s) Performed: CESAREAN SECTION (N/A )  Patient location during evaluation: Mother Baby Anesthesia Type: Epidural Level of consciousness: awake and alert Pain management: pain level controlled Vital Signs Assessment: post-procedure vital signs reviewed and stable Respiratory status: spontaneous breathing, nonlabored ventilation and respiratory function stable Cardiovascular status: stable Postop Assessment: no headache, no backache and epidural receding Anesthetic complications: no     Last Vitals:  Vitals:   10/21/19 1026 10/21/19 1027  BP:    Pulse: (!) 110 (!) 104  Resp:    Temp:  (!) 36.3 C  SpO2: 95% 96%    Last Pain:  Vitals:   10/21/19 1027  TempSrc: Oral  PainSc:                  Alison Stalling

## 2019-10-21 NOTE — Lactation Note (Signed)
This note was copied from a baby's chart. Lactation Consultation Note  Patient Name: Linda Parks [id number] Date: 10/21/2019 Reason for consult: Follow-up assessment  L&D nurse called out to [facility] for new nipple shield; mom had lost the one previously given. [place] visited mom yesterday after c/s delivery of her first baby for assistance with feed. Mom declined assistance stating she wanted to attempt on her own first. RN provided a nipple shield to aid in helping baby latch. LC assessed mom's nipples today to determine appropriate size to replace the lost shield with- LC notes that mom's anatomy does not call for a nipple shield; nipples are erect, LC attempted to educate mom on nipple shield as a barrier, and options of latching without the shield; mom again declines any breastfeeding assistance or education at this time. Size 108mm NS left with mom to continue feedings. Encouraged mom to call if support is needed.  Maternal Data Formula Feeding for Exclusion: No  Feeding Feeding Type: Breast Fed  LATCH Score                   Interventions    Lactation Tools Discussed/Used Tools: Nipple Shields Nipple shield size: 20   Consult Status Consult Status: PRN    Lavonia Drafts 10/21/2019, 12:04 PM

## 2019-10-21 NOTE — Progress Notes (Signed)
Subjective: Postpartum Day 1: Cesarean Delivery LTCS for arrest of dilation Patient reports tolerating PO, + flatus and no problems voiding.   Denies HA, changes in vision or epigastric pain.  Objective: Vital signs in last 24 hours: Temp:  [97.2 F (36.2 C)-98.1 F (36.7 C)] 98.1 F (36.7 C) (12/03 1938) Pulse Rate:  [80-116] 93 (12/03 1938) Resp:  [16-20] 16 (12/03 1938) BP: (114-140)/(68-97) 140/81 (12/03 1938) SpO2:  [90 %-100 %] 95 % (12/03 1938)  Physical Exam:  General: alert and cooperative Lochia: appropriate Uterine Fundus: firm Incision: dsg clean and intact DVT Evaluation: No evidence of DVT seen on physical exam.    Recent Labs    10/20/19 0100 10/21/19 0617  HGB 9.0* 7.9*  HCT 27.6* 23.4*    Assessment/Plan: Status post Cesarean section. Doing well postoperatively.  Continue current care.  Asymptomatic anemia - Fe BID Breastfeeding going well  Linda Parks 10/21/2019, 7:39 PM

## 2019-10-22 LAB — CBC
HCT: 22.9 % — ABNORMAL LOW (ref 36.0–46.0)
Hemoglobin: 7.7 g/dL — ABNORMAL LOW (ref 12.0–15.0)
MCH: 28.2 pg (ref 26.0–34.0)
MCHC: 33.6 g/dL (ref 30.0–36.0)
MCV: 83.9 fL (ref 80.0–100.0)
Platelets: 144 10*3/uL — ABNORMAL LOW (ref 150–400)
RBC: 2.73 MIL/uL — ABNORMAL LOW (ref 3.87–5.11)
RDW: 13.8 % (ref 11.5–15.5)
WBC: 9.6 10*3/uL (ref 4.0–10.5)
nRBC: 0 % (ref 0.0–0.2)

## 2019-10-22 MED ORDER — FERROUS SULFATE 325 (65 FE) MG PO TABS
325.0000 mg | ORAL_TABLET | Freq: Two times a day (BID) | ORAL | Status: DC
  Administered 2019-10-22: 325 mg via ORAL
  Filled 2019-10-22: qty 1

## 2019-10-22 MED ORDER — VITAMIN C 500 MG PO TABS
500.0000 mg | ORAL_TABLET | Freq: Two times a day (BID) | ORAL | Status: DC
  Filled 2019-10-22: qty 1

## 2019-10-22 MED ORDER — FERROUS SULFATE 325 (65 FE) MG PO TABS
325.0000 mg | ORAL_TABLET | Freq: Once | ORAL | Status: AC
  Administered 2019-10-22: 325 mg via ORAL
  Filled 2019-10-22: qty 1

## 2019-11-04 ENCOUNTER — Encounter: Payer: Self-pay | Admitting: Emergency Medicine

## 2019-11-04 ENCOUNTER — Other Ambulatory Visit: Payer: Self-pay

## 2019-11-04 ENCOUNTER — Emergency Department
Admission: EM | Admit: 2019-11-04 | Discharge: 2019-11-04 | Disposition: A | Discharge status: Home / Self Care | Payer: Medicaid Other | Attending: Emergency Medicine | Admitting: Emergency Medicine

## 2019-11-04 DIAGNOSIS — Z5321 Procedure and treatment not carried out due to patient leaving prior to being seen by health care provider: Secondary | ICD-10-CM | POA: Diagnosis not present

## 2019-11-04 DIAGNOSIS — R55 Syncope and collapse: Secondary | ICD-10-CM | POA: Diagnosis present

## 2019-11-04 LAB — COMPREHENSIVE METABOLIC PANEL
ALT: 14 U/L (ref 0–44)
AST: 17 U/L (ref 15–41)
Albumin: 3.8 g/dL (ref 3.5–5.0)
Alkaline Phosphatase: 90 U/L (ref 38–126)
Anion gap: 10 (ref 5–15)
BUN: 14 mg/dL (ref 6–20)
CO2: 24 mmol/L (ref 22–32)
Calcium: 9 mg/dL (ref 8.9–10.3)
Chloride: 106 mmol/L (ref 98–111)
Creatinine, Ser: 0.57 mg/dL (ref 0.44–1.00)
GFR calc Af Amer: >60 mL/min (ref >60)
GFR calc non Af Amer: >60 mL/min (ref >60)
Glucose, Bld: 119 mg/dL — ABNORMAL HIGH (ref 70–99)
Potassium: 3.8 mmol/L (ref 3.5–5.1)
Sodium: 140 mmol/L (ref 135–145)
Total Bilirubin: 0.5 mg/dL (ref 0.3–1.2)
Total Protein: 7.6 g/dL (ref 6.5–8.1)

## 2019-11-04 LAB — CBC
HCT: 32.3 % — ABNORMAL LOW (ref 36.0–46.0)
Hemoglobin: 10.3 g/dL — ABNORMAL LOW (ref 12.0–15.0)
MCH: 26.5 pg (ref 26.0–34.0)
MCHC: 31.9 g/dL (ref 30.0–36.0)
MCV: 83 fL (ref 80.0–100.0)
Platelets: 398 10*3/uL (ref 150–400)
RBC: 3.89 MIL/uL (ref 3.87–5.11)
RDW: 13.2 % (ref 11.5–15.5)
WBC: 7.7 10*3/uL (ref 4.0–10.5)
nRBC: 0 % (ref 0.0–0.2)

## 2019-11-04 NOTE — ED Triage Notes (Signed)
Pt reports felt really lightheaded in a store while holding her son so she handed him off to a friend and then she does not remember what happened. Pt admits that she left AMA after giving birth and knew she was anemic. Pt pale in triage.

## 2019-11-04 NOTE — ED Triage Notes (Signed)
Pt in via EMS from a gas station. EMS reports that patient fainted while in the gas station. Pt had a baby 2 weeks ago but left here AMA. EMS reports per pt she as severe anemia. BP sitting 100/80, standing 140/100.  BP 132/89 by EMS en route, 100%RA. FSBS 125

## 2019-11-04 NOTE — ED Notes (Signed)
Called pt from lobby with no reply.

## 2019-11-04 NOTE — ED Notes (Signed)
Called pt from lobby with no reply. Unable to locate pt.  

## 2019-11-09 ENCOUNTER — Telehealth: Payer: Self-pay | Admitting: Emergency Medicine

## 2019-11-09 NOTE — Telephone Encounter (Signed)
Called patient due to lwot to inquire about condition and follow up plans.  No answer and no voicemail  

## 2020-04-26 ENCOUNTER — Encounter: Payer: Self-pay | Admitting: Emergency Medicine

## 2020-04-26 ENCOUNTER — Emergency Department
Admission: EM | Admit: 2020-04-26 | Discharge: 2020-04-26 | Disposition: A | Discharge status: Home / Self Care | Payer: Medicaid Other | Attending: Emergency Medicine | Admitting: Emergency Medicine

## 2020-04-26 ENCOUNTER — Other Ambulatory Visit: Payer: Self-pay

## 2020-04-26 ENCOUNTER — Emergency Department: Payer: Medicaid Other

## 2020-04-26 DIAGNOSIS — Z3202 Encounter for pregnancy test, result negative: Secondary | ICD-10-CM | POA: Insufficient documentation

## 2020-04-26 DIAGNOSIS — Z79899 Other long term (current) drug therapy: Secondary | ICD-10-CM | POA: Insufficient documentation

## 2020-04-26 DIAGNOSIS — N939 Abnormal uterine and vaginal bleeding, unspecified: Secondary | ICD-10-CM | POA: Diagnosis present

## 2020-04-26 LAB — URINALYSIS, COMPLETE (UACMP) WITH MICROSCOPIC
Bacteria, UA: NONE SEEN
Bilirubin Urine: NEGATIVE
Glucose, UA: NEGATIVE mg/dL
Ketones, ur: NEGATIVE mg/dL
Leukocytes,Ua: NEGATIVE
Nitrite: NEGATIVE
Protein, ur: NEGATIVE mg/dL
Specific Gravity, Urine: 1.024 (ref 1.005–1.030)
pH: 6 (ref 5.0–8.0)

## 2020-04-26 LAB — TYPE AND SCREEN
ABO/RH(D): A POS
Antibody Screen: NEGATIVE

## 2020-04-26 LAB — BASIC METABOLIC PANEL
Anion gap: 8 (ref 5–15)
BUN: 12 mg/dL (ref 6–20)
CO2: 23 mmol/L (ref 22–32)
Calcium: 9 mg/dL (ref 8.9–10.3)
Chloride: 110 mmol/L (ref 98–111)
Creatinine, Ser: 0.73 mg/dL (ref 0.44–1.00)
GFR calc Af Amer: >60 mL/min (ref >60)
GFR calc non Af Amer: >60 mL/min (ref >60)
Glucose, Bld: 106 mg/dL — ABNORMAL HIGH (ref 70–99)
Potassium: 3.7 mmol/L (ref 3.5–5.1)
Sodium: 141 mmol/L (ref 135–145)

## 2020-04-26 LAB — CBC
HCT: 35.4 % — ABNORMAL LOW (ref 36.0–46.0)
Hemoglobin: 11.3 g/dL — ABNORMAL LOW (ref 12.0–15.0)
MCH: 26 pg (ref 26.0–34.0)
MCHC: 31.9 g/dL (ref 30.0–36.0)
MCV: 81.6 fL (ref 80.0–100.0)
Platelets: 172 10*3/uL (ref 150–400)
RBC: 4.34 MIL/uL (ref 3.87–5.11)
RDW: 15.9 % — ABNORMAL HIGH (ref 11.5–15.5)
WBC: 4.5 10*3/uL (ref 4.0–10.5)
nRBC: 0 % (ref 0.0–0.2)

## 2020-04-26 LAB — HCG, QUANTITATIVE, PREGNANCY: hCG, Beta Chain, Quant, S: <1 m[IU]/mL (ref <5)

## 2020-04-26 NOTE — ED Provider Notes (Signed)
Emergency Department Provider Note  ____________________________________________  Time seen: Approximately 4:24 PM  I have reviewed the triage vital signs and the nursing notes.   HISTORY  Chief Complaint Vaginal Bleeding   Historian Patient     HPI Linda Parks is a 19 y.o. female with a history of anemia, presents to the emergency department with concern for menorrhagia.  Patient states that she is on the 2nd day of her normal cycle.  She went to work yesterday and states that she used almost 2 boxes of tampons.  She denies pelvic pain or abdominal pain.  No dysuria, increased urinary frequency or low back pain.  She is concerned about possible pregnancy as she has had recent unprotected sex.   Past Medical History:  Diagnosis Date  . Major depressive episode 09/07/2015  . Mood swings   . Oppositional behavior   . Panic attacks 09/07/2015  . Social anxiety disorder 09/07/2015     Immunizations up to date:  Yes.     Past Medical History:  Diagnosis Date  . Major depressive episode 09/07/2015  . Mood swings   . Oppositional behavior   . Panic attacks 09/07/2015  . Social anxiety disorder 09/07/2015    Patient Active Problem List   Diagnosis Date Noted  . Rash 10/18/2019  . Elevated blood pressure affecting pregnancy in third trimester, antepartum 10/18/2019  . Abdominal pain in pregnancy, third trimester 08/04/2019  . Pregnancy 08/04/2019  . Supervision of low-risk pregnancy 03/23/2019  . Decrease in appetite   . Bipolar 1 disorder, depressed, moderate (HCC) 12/02/2015  . Major depressive episode 09/07/2015  . Social anxiety disorder 09/07/2015  . Panic attacks 09/07/2015    Past Surgical History:  Procedure Laterality Date  . CESAREAN SECTION N/A 10/20/2019   Procedure: CESAREAN SECTION;  Surgeon: Schermerhorn, Ihor Austin, MD;  Location: [facility] ORS;  Service: Obstetrics;  Laterality: N/A;  . NO PAST SURGERIES      Prior to Admission medications    Medication Sig Start Date End Date Taking? Authorizing Provider  clotrimazole-betamethasone (LOTRISONE) cream Apply 1 application topically 2 (two) times daily. Patient not taking: Reported on 08/04/2019 04/19/19   Nadara Mustard, MD  cyproheptadine (PERIACTIN) 4 MG tablet Take 0.5 tablets (2 mg total) by mouth 2 (two) times daily as needed (decrease appetite). Only take if patient continues with decreased appetite and loosing weight. Patient not taking: Reported on 08/04/2019 12/13/15   Thedora Hinders, MD  diphenhydrAMINE (BENADRYL) 25 mg capsule Take 1 capsule (25 mg total) by mouth every 6 (six) hours as needed. Patient not taking: Reported on 10/18/2019 03/26/18   Kem Boroughs B, FNP  famotidine (PEPCID) 40 MG tablet Take 1 tablet (40 mg total) by mouth every evening. 01/31/16 01/30/17  Rebecka Apley, MD  FLUoxetine (PROZAC) 10 MG capsule Please give 20mg  tabs.  Take 1 and 1/2 tab by mouth after breakfast to make total daily dose of 30mg . Patient not taking: Reported on 08/04/2019 12/13/15   08/06/2019, MD  fluticasone [facility]) 50 MCG/ACT nasal spray Place 2 sprays into both nostrils daily. Patient not taking: Reported on 08/04/2019 11/13/17   Menshew, 08/06/2019, PA-C  hydrocortisone 2.5 % cream Apply 1 application topically 2 (two) times daily as needed (for itching).    [provider]  ibuprofen (ADVIL,MOTRIN) 400 MG tablet Take 400 mg by mouth every 6 (six) hours as needed for cramping.    [provider]  ibuprofen (ADVIL,MOTRIN) 800 MG tablet Take  1 tablet (800 mg total) by mouth every 8 (eight) hours as needed. Patient not taking: Reported on 08/04/2019 06/12/17   Rebecka Apley, MD  magic mouthwash w/lidocaine SOLN Take 5 mLs by mouth 4 (four) times daily as needed for mouth pain. Patient not taking: Reported on 10/18/2019 02/01/18   Menshew, Charlesetta Ivory, PA-C  metoCLOPramide (REGLAN) 5 MG tablet Take 1 tablet (5 mg total) by  mouth every 8 (eight) hours as needed for nausea. 01/31/16 01/30/17  Rebecka Apley, MD  Multiple Vitamin (MULTIVITAMIN WITH MINERALS) TABS tablet Take 1 tablet by mouth daily. Patient not taking: Reported on 10/18/2019 12/13/15   Thedora Hinders, MD  ondansetron (ZOFRAN ODT) 4 MG disintegrating tablet Take 1 tablet (4 mg total) by mouth every 8 (eight) hours as needed. Patient not taking: Reported on 10/18/2019 01/27/18   Menshew, Charlesetta Ivory, PA-C  predniSONE (STERAPRED UNI-PAK 21 TAB) 10 MG (21) TBPK tablet Take 6 tablets on day 1 Take 5 tablets on day 2 Take 4 tablets on day 3 Take 3 tablets on day 4 Take 2 tablets on day 5 Take 1 tablet on day 6 Patient not taking: Reported on 08/04/2019 03/26/18   Kem Boroughs B, FNP  Prenatal Vit-Fe Fumarate-FA (MULTIVITAMIN-PRENATAL) 27-0.8 MG TABS tablet Take 1 tablet by mouth daily at 12 noon.    [provider]  risperiDONE (RISPERDAL) 1 MG tablet Take 1 tablet (1 mg total) by mouth 2 (two) times daily. 01/06/16 01/05/17  Phineas Semen, MD  sulfamethoxazole-trimethoprim (BACTRIM DS,SEPTRA DS) 800-160 MG tablet Take 1 tablet by mouth 2 (two) times daily. Patient not taking: Reported on 08/04/2019 03/19/16   Evangeline Dakin, PA-C  sulfamethoxazole-trimethoprim (BACTRIM DS,SEPTRA DS) 800-160 MG tablet Take 1 tablet by mouth 2 (two) times daily. Patient not taking: Reported on 08/04/2019 06/12/17   Rebecka Apley, MD    Allergies Amoxicillin and Zofran [facility] hcl]  Family History  Problem Relation Age of Onset  . Thyroid disease Mother     Social History Social History   Tobacco Use  . Smoking status: Never Smoker  . Smokeless tobacco: Never Used  Substance Use Topics  . Alcohol use: No  . Drug use: No     Review of Systems  Constitutional: No fever/chills Eyes:  No discharge ENT: No upper respiratory complaints. Respiratory: no cough. No SOB/ use of accessory muscles to breath Gastrointestinal:   No  nausea, no vomiting.  No diarrhea.  No constipation. Genitourinary: Patient has vaginal bleeding. Musculoskeletal: Negative for musculoskeletal pain. Skin: Negative for rash, abrasions, lacerations, ecchymosis.   ____________________________________________   PHYSICAL EXAM:  VITAL SIGNS: ED Triage Vitals  Enc Vitals Group     BP 04/26/20 1450 118/77     Pulse Rate 04/26/20 1450 90     Resp 04/26/20 1450 16     Temp 04/26/20 1450 98.4 F (36.9 C)     Temp Source 04/26/20 1450 Oral     SpO2 04/26/20 1450 100 %     Weight 04/26/20 1451 136 lb (61.7 kg)     Height 04/26/20 1451 5' (1.524 m)     Head Circumference --      Peak Flow --      Pain Score 04/26/20 1451 0     Pain Loc --      Pain Edu? --      Excl. in GC? --      Constitutional: Alert and oriented. Well appearing and in no acute distress.  Eyes: Conjunctivae are normal. PERRL. EOMI. Head: Atraumatic. Cardiovascular: Normal rate, regular rhythm. Normal S1 and S2.  Good peripheral circulation. Respiratory: Normal respiratory effort without tachypnea or retractions. Lungs CTAB. Good air entry to the bases with no decreased or absent breath sounds Gastrointestinal: Bowel sounds x 4 quadrants. Soft and nontender to palpation. No guarding or rigidity. No distention. Musculoskeletal: Full range of motion to all extremities. No obvious deformities noted Neurologic:  Normal for age. No gross focal neurologic deficits are appreciated.  Skin:  Skin is warm, dry and intact. No rash noted. Psychiatric: Mood and affect are normal for age. Speech and behavior are normal.   ____________________________________________   LABS (all labs ordered are listed, but only abnormal results are displayed)  Labs Reviewed  CBC - Abnormal; Notable for the following components:      Result Value   Hemoglobin 11.3 (*)    HCT 35.4 (*)    RDW 15.9 (*)    All other components within normal limits  BASIC METABOLIC PANEL - Abnormal; Notable  for the following components:   Glucose, Bld 106 (*)    All other components within normal limits  URINALYSIS, COMPLETE (UACMP) WITH MICROSCOPIC - Abnormal; Notable for the following components:   Color, Urine YELLOW (*)    APPearance HAZY (*)    Hgb urine dipstick LARGE (*)    All other components within normal limits  HCG, QUANTITATIVE, PREGNANCY  POC URINE PREG, ED  TYPE AND SCREEN   ____________________________________________  EKG   ____________________________________________  RADIOLOGY Unk Pinto, personally viewed and evaluated these images (plain radiographs) as part of my medical decision making, as well as reviewing the written report by the radiologist.    US PELVIC COMPLETE WITH TRANSVAGINAL  Result Date: 04/26/2020 CLINICAL DATA:  Vaginal bleeding x1 day. EXAM: TRANSABDOMINAL AND TRANSVAGINAL ULTRASOUND OF PELVIS DOPPLER ULTRASOUND OF OVARIES TECHNIQUE: Both transabdominal and transvaginal ultrasound examinations of the pelvis were performed. Transabdominal technique was performed for global imaging of the pelvis including uterus, ovaries, adnexal regions, and pelvic cul-de-sac. It was necessary to proceed with endovaginal exam following the transabdominal exam to visualize the uterus, endometrium, bilateral ovaries and bilateral adnexa. Color and duplex Doppler ultrasound was utilized to evaluate blood flow to the ovaries. COMPARISON:  None. FINDINGS: Uterus Measurements: 8.0 cm x 4.0 cm x 4.9 cm = volume: 82 mL. No fibroids or other mass visualized. Endometrium Thickness: 9.6 mm.  No focal abnormality visualized. Right ovary Measurements: 3.1 cm x 1.9 cm x 2.1 cm = volume: 7 mL. Normal appearance/no adnexal mass. Left ovary Measurements: 3.4 cm x 2.1 cm x 2.5 cm = volume: 9 mL. Normal appearance/no adnexal mass. Pulsed Doppler evaluation of both ovaries demonstrates normal low-resistance arterial and venous waveforms. Other findings A trace amount of pelvic free fluid  is seen. IMPRESSION: Normal pelvic ultrasound. Electronically Signed   By: Virgina Norfolk M.D.   On: 04/26/2020 18:17    ____________________________________________    PROCEDURES  Procedure(s) performed:     Procedures     Medications - No data to display   ____________________________________________   INITIAL IMPRESSION / ASSESSMENT AND PLAN / ED COURSE  Pertinent labs & imaging results that were available during my care of the patient were reviewed by me and considered in my medical decision making (see chart for details).      Assessment and Plan:  Vaginal bleeding 19 year old female presents to the emergency department with concern for menorrhagia.  Vital signs were reassuring  at triage.  On physical exam, patient was resting comfortably.  Abdomen was soft and nontender without complaints of pain.  CBC indicated a reassuring H&H.  Beta-hCG was less than 1.  Urinalysis revealed a large amount of blood but no other concerning findings.  Pelvic ultrasound reveals no acute abnormality.  Patient was advised to follow-up with OB/GYN.  Return precautions were given to return with new or worsening symptoms.  ____________________________________________  FINAL CLINICAL IMPRESSION(S) / ED DIAGNOSES  Final diagnoses:  Vaginal bleeding      NEW MEDICATIONS STARTED DURING THIS VISIT:  ED Discharge Orders    None          This chart was dictated using voice recognition software/Dragon. Despite best efforts to proofread, errors can occur which can change the meaning. Any change was purely unintentional.     Orvil Feil, PA-C 04/26/20 Guy Franco, MD 04/26/20 2110

## 2020-04-26 NOTE — Discharge Instructions (Signed)
Please make follow-up appointment with OB/GYN.

## 2020-04-26 NOTE — ED Notes (Signed)
Pt given blanket.

## 2020-04-26 NOTE — ED Triage Notes (Signed)
Pt here for vaginal bleeding. Reports very heavy bleeding starting yesterday. Had 2 periods last month. This is normal period but heavier bleeding than normal.  Reports 6 tampons today and 2 boxes yesterday.

## 2020-07-21 IMAGING — US US PELVIS COMPLETE WITH TRANSVAGINAL
1 series · 13 of 25 positions shown · non-contrast
Comparison: None.

CLINICAL DATA: Vaginal bleeding x1 day.

EXAM:
TRANSABDOMINAL AND TRANSVAGINAL ULTRASOUND OF PELVIS
DOPPLER ULTRASOUND OF OVARIES
TECHNIQUE: Both transabdominal and transvaginal ultrasound examinations of the
pelvis were performed. Transabdominal technique was performed for
global imaging of the pelvis including uterus, ovaries, adnexal
regions, and pelvic cul-de-sac.
It was necessary to proceed with endovaginal exam following the
transabdominal exam to visualize the uterus, endometrium, bilateral
ovaries and bilateral adnexa. Color and duplex Doppler ultrasound
was utilized to evaluate blood flow to the ovaries.

[Series 1: us pelvis (transabdominal only) · 13 of 108 slices shown]
[im 1/108]
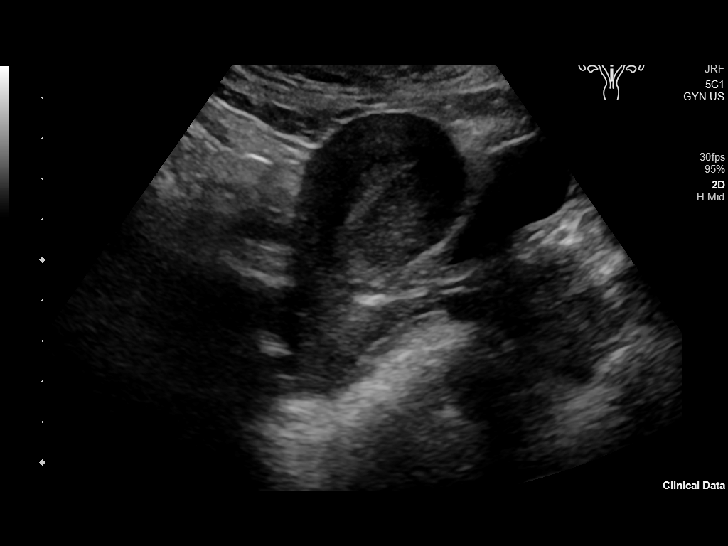
[im 9/108]
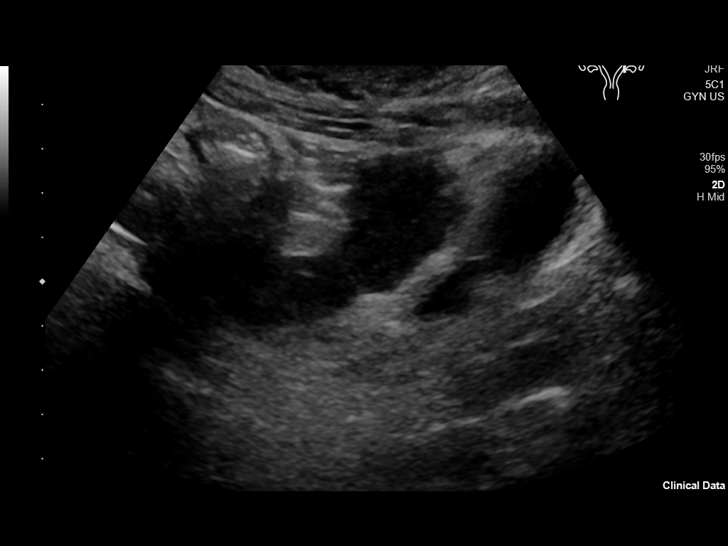
[im 18/108]
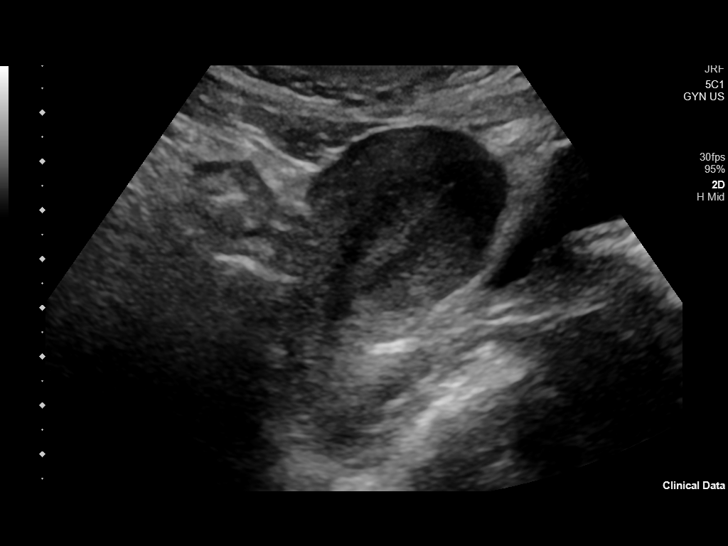
[im 27/108]
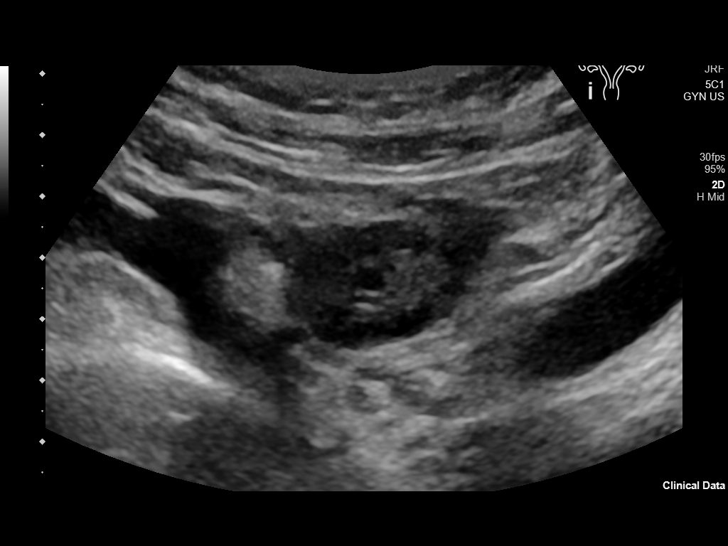
[im 36/108]
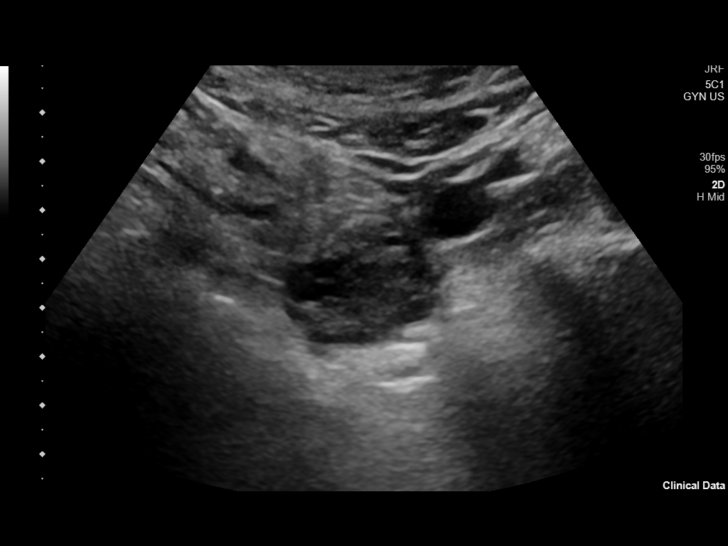
[im 45/108]
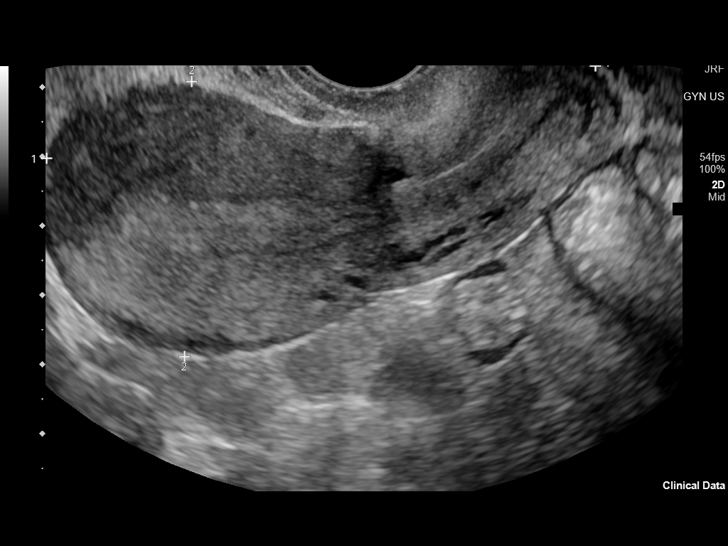
[im 54/108]
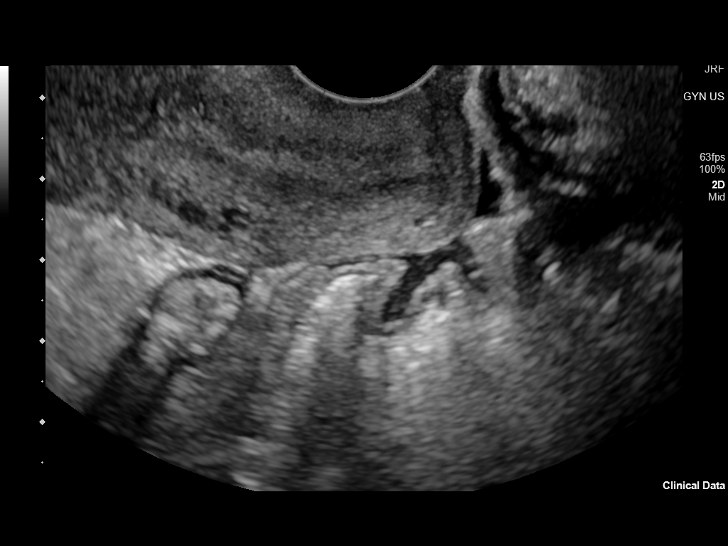
[im 63/108]
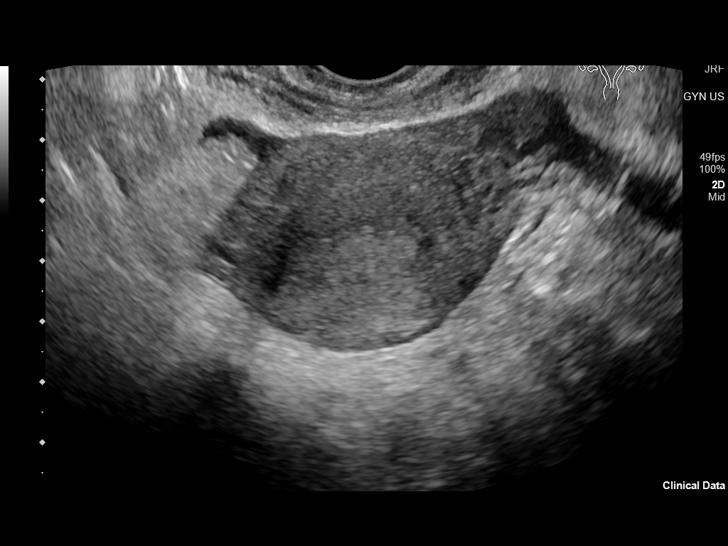
[im 72/108]
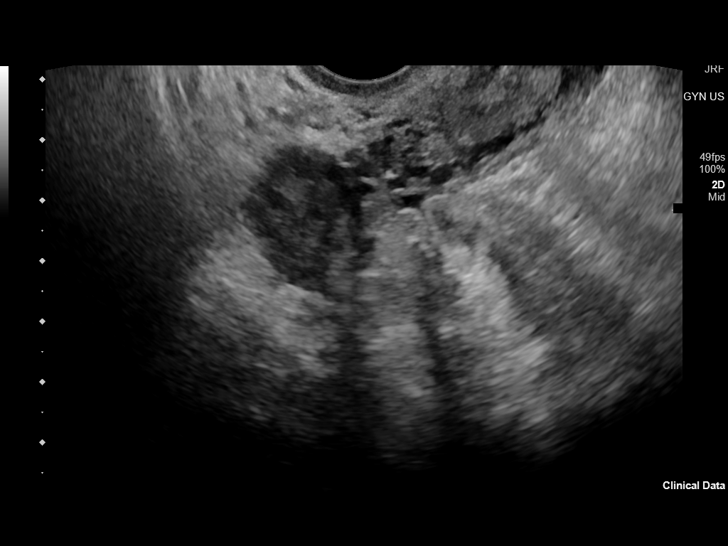
[im 81/108]
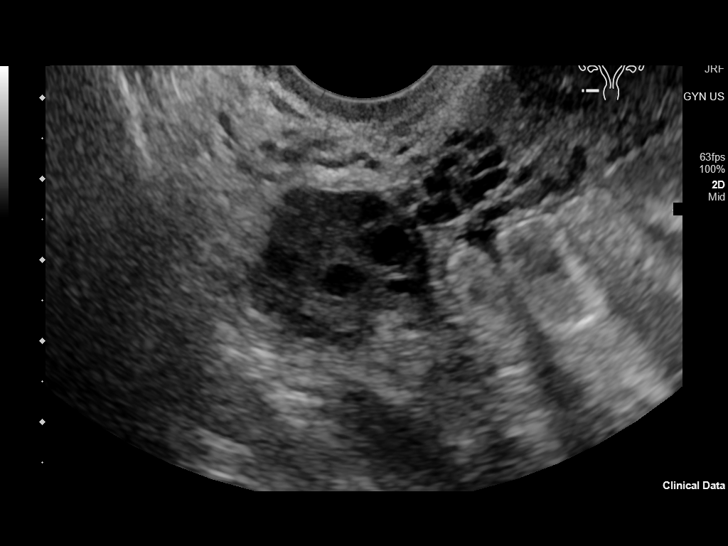
[im 90/108]
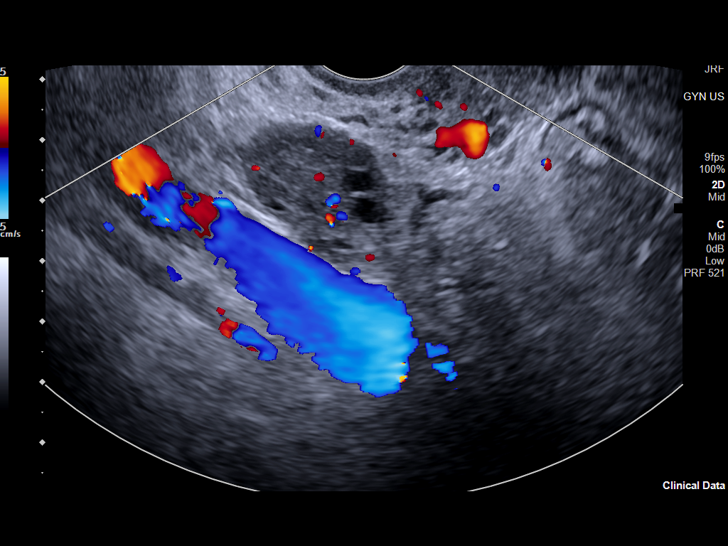
[im 99/108]
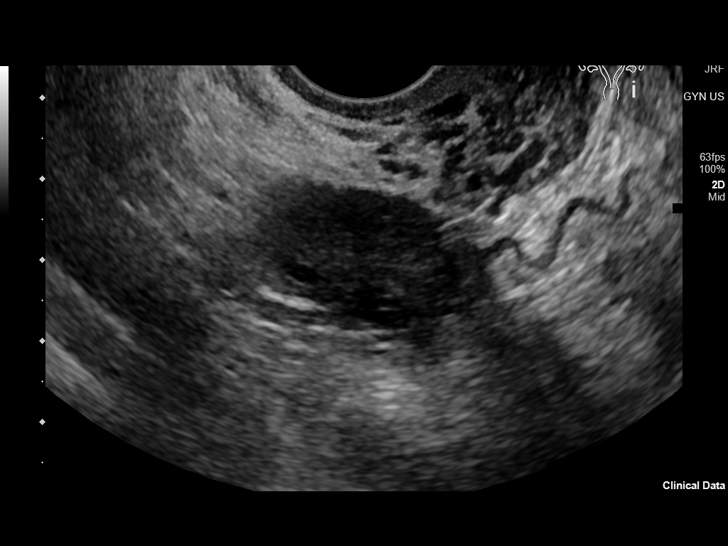
[im 108/108]
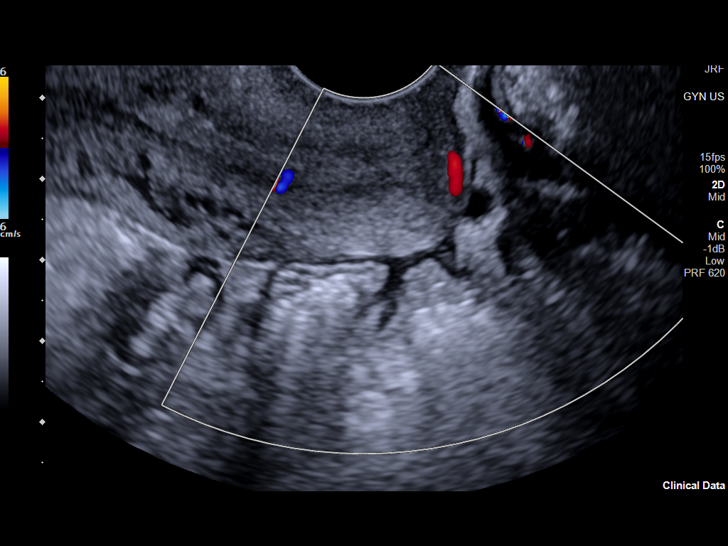

[13 of 25 positions shown; findings below may reference images not displayed]

FINDINGS: Uterus

Measurements: 8.0 cm x 4.0 cm x 4.9 cm = volume: 82 mL. No fibroids
or other mass visualized.

Endometrium

Thickness: 9.6 mm.  No focal abnormality visualized.

Right ovary

Measurements: 3.1 cm x 1.9 cm x 2.1 cm = volume: 7 mL. Normal
appearance/no adnexal mass.

Left ovary

Measurements: 3.4 cm x 2.1 cm x 2.5 cm = volume: 9 mL. Normal
appearance/no adnexal mass.

Pulsed Doppler evaluation of both ovaries demonstrates normal
low-resistance arterial and venous waveforms.

Other findings

A trace amount of pelvic free fluid is seen.
IMPRESSION: Normal pelvic ultrasound.

## 2021-10-02 ENCOUNTER — Observation Stay
Admission: EM | Admit: 2021-10-02 | Discharge: 2021-10-02 | Disposition: A | Discharge status: Home / Self Care | Payer: Medicaid Other | Attending: Obstetrics and Gynecology | Admitting: Obstetrics and Gynecology

## 2021-10-02 ENCOUNTER — Encounter: Payer: Self-pay | Admitting: Obstetrics and Gynecology

## 2021-10-02 DIAGNOSIS — Z3A33 33 weeks gestation of pregnancy: Secondary | ICD-10-CM | POA: Insufficient documentation

## 2021-10-02 DIAGNOSIS — R109 Unspecified abdominal pain: Secondary | ICD-10-CM | POA: Insufficient documentation

## 2021-10-02 DIAGNOSIS — D696 Thrombocytopenia, unspecified: Secondary | ICD-10-CM | POA: Insufficient documentation

## 2021-10-02 DIAGNOSIS — Z20822 Contact with and (suspected) exposure to covid-19: Secondary | ICD-10-CM | POA: Diagnosis not present

## 2021-10-02 DIAGNOSIS — O26893 Other specified pregnancy related conditions, third trimester: Secondary | ICD-10-CM | POA: Diagnosis present

## 2021-10-02 DIAGNOSIS — R112 Nausea with vomiting, unspecified: Secondary | ICD-10-CM | POA: Diagnosis present

## 2021-10-02 DIAGNOSIS — Z3490 Encounter for supervision of normal pregnancy, unspecified, unspecified trimester: Secondary | ICD-10-CM

## 2021-10-02 LAB — WET PREP, GENITAL
Clue Cells Wet Prep HPF POC: NONE SEEN
Sperm: NONE SEEN
Trich, Wet Prep: NONE SEEN
Yeast Wet Prep HPF POC: NONE SEEN

## 2021-10-02 LAB — CHLAMYDIA/NGC RT PCR (ARMC ONLY)
Chlamydia Tr: NOT DETECTED
N gonorrhoeae: NOT DETECTED

## 2021-10-02 LAB — RESP PANEL BY RT-PCR (FLU A&B, COVID) ARPGX2
Influenza A by PCR: NEGATIVE
Influenza B by PCR: NEGATIVE
SARS Coronavirus 2 by RT PCR: NEGATIVE

## 2021-10-02 LAB — URINALYSIS, ROUTINE W REFLEX MICROSCOPIC
Bilirubin Urine: NEGATIVE
Glucose, UA: NEGATIVE mg/dL
Hgb urine dipstick: NEGATIVE
Ketones, ur: NEGATIVE mg/dL
Leukocytes,Ua: NEGATIVE
Nitrite: NEGATIVE
Protein, ur: NEGATIVE mg/dL
Specific Gravity, Urine: 1.027 (ref 1.005–1.030)
pH: 5 (ref 5.0–8.0)

## 2021-10-02 MED ORDER — LACTATED RINGERS IV BOLUS
500.0000 mL | Freq: Once | INTRAVENOUS | Status: AC
  Administered 2021-10-02: 500 mL via INTRAVENOUS

## 2021-10-02 MED ORDER — SODIUM CHLORIDE 0.9 % IV SOLN
12.5000 mg | Freq: Once | INTRAVENOUS | Status: AC
  Administered 2021-10-02: 12.5 mg via INTRAVENOUS
  Filled 2021-10-02: qty 12.5

## 2021-10-02 NOTE — Discharge Summary (Addendum)
Patient ID: Linda Parks MRN: [id number] DOB/AGE: 19-May-2001 20 y.o.  Admit date: 10/02/2021 Discharge date: 10/02/2021  Admission Diagnoses: 20 yo G3P1 at [redacted]w[redacted]d presents with abdominal pain  Discharge Diagnoses: Pain resolved  Factors complicating pregnancy: Chlamydia 09/2019 at 36wks History of marijuana use Rubella Non-Immune:  Thrombocytopenia Anemia:  Monitoring for PreE  Prenatal Procedures: NST  Consults: None  Significant Diagnostic Studies:  Results for orders placed or performed during the hospital encounter of 10/02/21 (from the past 168 hour(s))  Resp Panel by RT-PCR (Flu A&B, Covid) Nasopharyngeal Swab   Collection Time: 10/02/21  1:58 AM   Specimen: Nasopharyngeal Swab; Nasopharyngeal(NP) swabs in vial transport medium  Result Value Ref Range   SARS Coronavirus 2 by RT PCR NEGATIVE NEGATIVE   Influenza A by PCR NEGATIVE NEGATIVE   Influenza B by PCR NEGATIVE NEGATIVE  Wet prep, genital   Collection Time: 10/02/21  1:58 AM  Result Value Ref Range   Yeast Wet Prep HPF POC NONE SEEN NONE SEEN   Trich, Wet Prep NONE SEEN NONE SEEN   Clue Cells Wet Prep HPF POC NONE SEEN NONE SEEN   WBC, Wet Prep HPF POC RARE (A) <10   Sperm NONE SEEN   Chlamydia/NGC rt PCR ([facility] only)   Collection Time: 10/02/21  1:58 AM  Result Value Ref Range   Specimen source GC/Chlam ENDOCERVICAL    Chlamydia Tr NOT DETECTED NOT DETECTED   N gonorrhoeae NOT DETECTED NOT DETECTED  Urinalysis, Routine w reflex microscopic Nasopharyngeal Swab   Collection Time: 10/02/21  1:58 AM  Result Value Ref Range   Color, Urine YELLOW (A) YELLOW   APPearance CLOUDY (A) CLEAR   Specific Gravity, Urine 1.027 1.005 - 1.030   pH 5.0 5.0 - 8.0   Glucose, UA NEGATIVE NEGATIVE mg/dL   Hgb urine dipstick NEGATIVE NEGATIVE   Bilirubin Urine NEGATIVE NEGATIVE   Ketones, ur NEGATIVE NEGATIVE mg/dL   Protein, ur NEGATIVE NEGATIVE mg/dL   Nitrite NEGATIVE NEGATIVE   Leukocytes,Ua NEGATIVE NEGATIVE     Treatments: none  Hospital Course:  This is a 20 y.o. G3P1011 with IUP at [redacted]w[redacted]d seen for abdominal pain.  No leaking of fluid and no bleeding.  She was observed, fetal heart rate monitoring remained reassuring, and she had no signs/symptoms of preterm labor or other maternal-fetal concerns.  She was deemed stable for discharge to home with outpatient follow up.  Discharge Physical Exam:  BP 120/70 (BP Location: Left Arm)   Pulse (!) 110   Temp 98 F (36.7 C) (Oral)   Resp 20   Ht 5' (1.524 m)   Wt 65.8 kg   LMP 01/20/2021 (Exact Date)   BMI 28.32 kg/m   General: NAD CV: RRR Pulm: nl effort ABD: s/nd/nt, gravid DVT Evaluation: LE non-ttp, no evidence of DVT on exam.  NST: FHR baseline: 125 bpm Variability: moderate Accelerations: yes Decelerations: none Category/reactivity: reactive  TOCO: quiet SVE: deferred      Discharge Condition: Stable  Disposition: Discharge disposition: 01-Home or Self Care       Allergies as of 10/02/2021       Reactions   Amoxicillin    Yeast infections   Zofran [ondansetron Hcl]         Medication List     ASK your doctor about these medications    clotrimazole-betamethasone cream Commonly known as: Lotrisone Apply 1 application topically 2 (two) times daily.   cyproheptadine 4 MG tablet Commonly known as: PERIACTIN Take 0.5 tablets (  2 mg total) by mouth 2 (two) times daily as needed (decrease appetite). Only take if patient continues with decreased appetite and loosing weight.   diphenhydrAMINE 25 mg capsule Commonly known as: BENADRYL Take 1 capsule (25 mg total) by mouth every 6 (six) hours as needed.   famotidine 40 MG tablet Commonly known as: PEPCID Take 1 tablet (40 mg total) by mouth every evening.   FLUoxetine 10 MG capsule Commonly known as: PROZAC Please give 20mg  tabs.  Take 1 and 1/2 tab by mouth after breakfast to make total daily dose of 30mg .   fluticasone 50 MCG/ACT nasal spray Commonly  known as: Flonase Place 2 sprays into both nostrils daily.   hydrocortisone 2.5 % cream Apply 1 application topically 2 (two) times daily as needed (for itching).   ibuprofen 400 MG tablet Commonly known as: ADVIL Take 400 mg by mouth every 6 (six) hours as needed for cramping.   ibuprofen 800 MG tablet Commonly known as: ADVIL Take 1 tablet (800 mg total) by mouth every 8 (eight) hours as needed.   magic mouthwash w/lidocaine Soln Take 5 mLs by mouth 4 (four) times daily as needed for mouth pain.   metoCLOPramide 5 MG tablet Commonly known as: Reglan Take 1 tablet (5 mg total) by mouth every 8 (eight) hours as needed for nausea.   multivitamin with minerals Tabs tablet Take 1 tablet by mouth daily.   multivitamin-prenatal 27-0.8 MG Tabs tablet Take 1 tablet by mouth daily at 12 noon.   ondansetron 4 MG disintegrating tablet Commonly known as: Zofran ODT Take 1 tablet (4 mg total) by mouth every 8 (eight) hours as needed.   predniSONE 10 MG (21) Tbpk tablet Commonly known as: STERAPRED UNI-PAK 21 TAB Take 6 tablets on day 1 Take 5 tablets on day 2 Take 4 tablets on day 3 Take 3 tablets on day 4 Take 2 tablets on day 5 Take 1 tablet on day 6   risperiDONE 1 MG tablet Commonly known as: RisperDAL Take 1 tablet (1 mg total) by mouth 2 (two) times daily.   sulfamethoxazole-trimethoprim 800-160 MG tablet Commonly known as: BACTRIM DS Take 1 tablet by mouth 2 (two) times daily.   sulfamethoxazole-trimethoprim 800-160 MG tablet Commonly known as: BACTRIM DS Take 1 tablet by mouth 2 (two) times daily.         Signed , CNM 10/04/2021 1:20 PM

## 2021-10-02 NOTE — OB Triage Note (Signed)
Patient called out and stated "I feel much better, can I go home?" Labs returned all negative, patient okay to be discharged per CNM. Discharge instructions and follow-up care reviewed with patient. Patient verbalized understanding. Patient discharged home with significant other.

## 2021-10-02 NOTE — OB Triage Note (Signed)
Pt reports to unit c/o abdominal pain "all over her abdomen that is mostly constant but comes and goes" since this morning. Patient reports having nausea, emesis x2, diarrhea x5. Patient reports receiving prenatal care in [facility] but coming to this hospital because it was closer. Reports +FM, denies vaginal bleeding and LOF. Patient does report sexual intercourse and frequency in urination.

## 2023-01-16 ENCOUNTER — Encounter: Payer: Self-pay | Admitting: Family

## 2023-01-16 ENCOUNTER — Ambulatory Visit: Payer: Medicaid Other | Admitting: Family

## 2023-01-16 DIAGNOSIS — Z113 Encounter for screening for infections with a predominantly sexual mode of transmission: Secondary | ICD-10-CM | POA: Diagnosis not present

## 2023-01-16 LAB — WET PREP FOR TRICH, YEAST, CLUE
Trichomonas Exam: NEGATIVE
Yeast Exam: NEGATIVE

## 2023-01-16 NOTE — Progress Notes (Signed)
[facility] Department  STI clinic/screening visit [place] [address] [phone]  Subjective:  Linda Parks is a 22 y.o. female being seen today for an STI screening visit. The patient reports they do not have symptoms.  Patient reports that they do desire a pregnancy in the next year.   They reported they are not interested in discussing contraception today.    Patient's last menstrual period was 12/15/2022 (exact date).  Patient has the following medical conditions:   Patient Active Problem List   Diagnosis Date Noted   Nausea & vomiting 10/02/2021   Rash 10/18/2019   Elevated blood pressure affecting pregnancy in third trimester, antepartum 10/18/2019   Abdominal pain in pregnancy, third trimester 08/04/2019   Pregnancy 08/04/2019   Supervision of low-risk pregnancy 03/23/2019   Decrease in appetite    Bipolar 1 disorder, depressed, moderate ([place]) 12/02/2015   Major depressive episode 09/07/2015   Social anxiety disorder 09/07/2015   Panic attacks 09/07/2015    Chief Complaint  Patient presents with   SEXUALLY TRANSMITTED DISEASE    Screening    HPI  Patient reports no symptoms, but gets STD screening every 6 months since diagnosed with chlamydia multiple times in the past.  Does the patient using douching products? No  Last HIV test per patient/review of record was No results found for: "HMHIVSCREEN"  Lab Results  Component Value Date   HIV Non-reactive 03/23/2019   Patient reports last pap was No results found for: "DIAGPAP" No results found for: "SPECADGYN"  Screening for MPX risk: Does the patient have an unexplained rash? No Is the patient MSM? No Does the patient endorse multiple sex partners or anonymous sex partners? No Did the patient have close or sexual contact with a person diagnosed with MPX? No Has the patient traveled outside the [place] where MPX is endemic? No Is there a high clinical suspicion for MPX--  evidenced by one of the following No  -Unlikely to be chickenpox  -Lymphadenopathy  -Rash that present in same phase of evolution on any given body part See flowsheet for further details and programmatic requirements.   Immunization history:  There is no immunization history for the selected administration types on file for this patient.   The following portions of the patient's history were reviewed and updated as appropriate: allergies, current medications, past medical history, past social history, past surgical history and problem list.  Objective:  There were no vitals filed for this visit.  Physical Exam Patient declines physical examination today, chooses to self-swab for vaginal GC/CT and wet prep.  Assessment and Plan:  Linda Parks is a 22 y.o. female presenting [facility] Department for STI screening  1. Screening examination for venereal disease Will contact if positive results. Use condoms for all sex if unsure of partner's sexual activity.  - [place], [place], CLUE   Patient accepted all screenings including vaginal GC/CT and wet prep. Patient meets criteria for HepB screening? No. Ordered? no Patient meets criteria for HepC screening? No. Ordered? no  Treat wet prep per standing order Discussed time line for State Lab results and that patient will be called with positive results and encouraged patient to call if she had not heard in 2 weeks.  Counseled to return or seek care for continued or worsening symptoms Recommended repeat testing in 3 months with positive results. Recommended condom use with all sex  Patient  is currently using nothing to prevent pregnancy.    Return if symptoms worsen or fail to improve.  No future appointments.  Marline Backbone, FNP

## 2023-01-16 NOTE — Progress Notes (Signed)
Pt is here for STD screening.  Pt denies having any symptoms.  Wet mount results reviewed, no treatment needed per Provider.  Pt declined condoms.  Windle Guard, RN

## 2023-04-17 ENCOUNTER — Ambulatory Visit (LOCAL_COMMUNITY_HEALTH_CENTER): Payer: Medicaid Other

## 2023-04-17 VITALS — BP 100/64 | Ht 60.0 in | Wt 141.0 lb

## 2023-04-17 DIAGNOSIS — Z3201 Encounter for pregnancy test, result positive: Secondary | ICD-10-CM | POA: Diagnosis not present

## 2023-04-17 DIAGNOSIS — Z309 Encounter for contraceptive management, unspecified: Secondary | ICD-10-CM | POA: Diagnosis not present

## 2023-04-17 LAB — PREGNANCY, URINE: Preg Test, Ur: POSITIVE — AB

## 2023-04-17 MED ORDER — PRENATAL 27-0.8 MG PO TABS: 1.0000 | ORAL_TABLET | Freq: Every day | ORAL | 0 refills | Status: AC

## 2023-04-17 NOTE — Progress Notes (Signed)
UPT positive. Unsure where she plans prenatal care. Local prenatal provider resource list given. Counseled to establish Prenatal care. Positive preg packet given and reviewed.   Patient takes buspar and sertraline which were prescribed at [facility].  Consult Aliene Altes, FNP who recommends patient to contact prescriber for guidance with prescription meds. Provider recommends patient to not stop taking these meds before she speaks to her provider. Psych meds should not be stopped abruptly.   RN explained provider recommendations to patient and she is in agreement. Questions answered and reports understanding.   The patient was dispensed prenatal vitamins #100 today per [facility] Dr Ralene Bathe. I provided counseling today regarding the medication. We discussed the medication, the side effects and when to call clinic. Patient given the opportunity to ask questions. Questions answered.    Sent to [facility] for medicaid/preg women. Jerel Shepherd, RN

## 2023-06-02 LAB — PANORAMA PRENATAL TEST FULL PANEL:PANORAMA TEST PLUS 5 ADDITIONAL MICRODELETIONS: FETAL FRACTION: 12.9

## 2024-05-17 ENCOUNTER — Other Ambulatory Visit: Payer: Self-pay

## 2024-05-17 ENCOUNTER — Emergency Department
Admission: EM | Admit: 2024-05-17 | Discharge: 2024-05-17 | Disposition: A | Discharge status: Home / Self Care | Attending: Emergency Medicine | Admitting: Emergency Medicine

## 2024-05-17 ENCOUNTER — Encounter: Payer: Self-pay | Admitting: *Deleted

## 2024-05-17 DIAGNOSIS — J358 Other chronic diseases of tonsils and adenoids: Secondary | ICD-10-CM | POA: Insufficient documentation

## 2024-05-17 DIAGNOSIS — J029 Acute pharyngitis, unspecified: Secondary | ICD-10-CM | POA: Insufficient documentation

## 2024-05-17 LAB — GROUP A STREP BY PCR: Group A Strep by PCR: NOT DETECTED

## 2024-05-17 NOTE — ED Triage Notes (Signed)
 Pt has tonsil stones per pt.  Pt has increased pain.  Sx began yesterday.  Pt has a sore throat.  Pt alert.

## 2024-05-17 NOTE — Discharge Instructions (Signed)
 Follow-up with ENT. If you are not able to get an appointment you could use a Waterpik to wash away the tonsil stones The only way to definitely get rid of tonsil stones is to have your tonsils removed.

## 2024-05-17 NOTE — ED Provider Notes (Signed)
 [facility] Provider Note    Event Date/Time   First MD Initiated Contact with Patient 05/17/24 2041     (approximate)   History   Sore Throat   HPI  Linda Parks is a 23 y.o. female with no significant past medical history presents emergency department with concerns of tonsil stones.  Patient's had increased pain and saw several more tonsil stones.  Wants to know if we can get the remainder of them out.  No fever or chills.      Physical Exam   Triage Vital Signs: ED Triage Vitals  Encounter Vitals Group     BP 05/17/24 2017 109/74     Girls Systolic BP Percentile --      Girls Diastolic BP Percentile --      Boys Systolic BP Percentile --      Boys Diastolic BP Percentile --      Pulse Rate 05/17/24 2017 87     Resp 05/17/24 2017 19     Temp 05/17/24 2017 98.5 F (36.9 C)     Temp Source 05/17/24 2017 Oral     SpO2 05/17/24 2017 97 %     Weight 05/17/24 2017 136 lb (61.7 kg)     Height 05/17/24 2017 5' (1.524 m)     Head Circumference --      Peak Flow --      Pain Score 05/17/24 2021 6     Pain Loc --      Pain Education --      Exclude from Growth Chart --     Most recent vital signs: Vitals:   05/17/24 2017  BP: 109/74  Pulse: 87  Resp: 19  Temp: 98.5 F (36.9 C)  SpO2: 97%     General: Awake, no distress.   CV:  Good peripheral perfusion. Resp:  Normal effort.  Abd:  No distention.   Other:  Throat with irritated tonsils, no obvious stones noted, neck is supple, no lymphadenopathy   ED Results / Procedures / Treatments   Labs (all labs ordered are listed, but only abnormal results are displayed) Labs Reviewed  GROUP A STREP BY PCR     EKG     RADIOLOGY     PROCEDURES:   Procedures  Critical Care: No Chief Complaint  Patient presents with   Sore Throat      MEDICATIONS ORDERED IN ED: Medications - No data to display   IMPRESSION / MDM / ASSESSMENT AND PLAN / ED COURSE  I reviewed the  triage vital signs and the nursing notes.                              Differential diagnosis includes, but is not limited to, tonsil stone, pharyngitis, strep throat  Patient's presentation is most consistent with acute illness / injury with system symptoms.    Medications given none  Strep test is reassuring  I did explain the findings to the patient.  Explained to her that tonsil stones will not go away until she has tonsils removed.  She can follow-up with ENT to discuss.  In the meantime she could buy a Waterpik to help sprayed tonsil stones out.  Cautioned her to not poke stones as she can cause a tear in this tissue in the posterior throat.  She states she understands.  She was discharged stable condition.      FINAL CLINICAL IMPRESSION(S) /  ED DIAGNOSES   Final diagnoses:  Pharyngitis, unspecified etiology  Tonsil stone     Rx / DC Orders   ED Discharge Orders     None        Note:  This document was prepared using Dragon voice recognition software and may include unintentional dictation errors.    Gasper Devere ORN, PA-C 05/17/24 2144    Jacolyn Pae, MD 05/17/24 [phone]
